# Patient Record
Sex: Female | Born: 1955 | Race: White | Hispanic: No | Marital: Married
Health system: Southern US, Community
[De-identification: ages and names within clinical notes are randomized; demographics above are authoritative.]

---

## 2004-08-16 ENCOUNTER — Ambulatory Visit: Payer: Self-pay | Admitting: Unknown Physician Specialty

## 2005-09-19 ENCOUNTER — Ambulatory Visit: Payer: Self-pay | Admitting: Unknown Physician Specialty

## 2005-09-22 ENCOUNTER — Ambulatory Visit: Payer: Self-pay | Admitting: Unknown Physician Specialty

## 2006-12-26 ENCOUNTER — Ambulatory Visit: Payer: Self-pay | Admitting: Unknown Physician Specialty

## 2007-01-02 ENCOUNTER — Ambulatory Visit: Payer: Self-pay | Admitting: Unknown Physician Specialty

## 2007-08-30 ENCOUNTER — Ambulatory Visit: Payer: Self-pay | Admitting: Family Medicine

## 2007-11-20 ENCOUNTER — Ambulatory Visit: Payer: Self-pay | Admitting: Unknown Physician Specialty

## 2008-01-01 ENCOUNTER — Ambulatory Visit: Payer: Self-pay | Admitting: Unknown Physician Specialty

## 2009-01-20 ENCOUNTER — Ambulatory Visit: Payer: Self-pay | Admitting: Unknown Physician Specialty

## 2010-02-18 ENCOUNTER — Ambulatory Visit: Payer: Self-pay | Admitting: Unknown Physician Specialty

## 2011-03-03 ENCOUNTER — Ambulatory Visit: Payer: Self-pay | Admitting: Unknown Physician Specialty

## 2012-03-08 ENCOUNTER — Ambulatory Visit: Payer: Self-pay

## 2013-05-28 ENCOUNTER — Ambulatory Visit: Payer: Self-pay | Admitting: Family Medicine

## 2013-09-05 ENCOUNTER — Ambulatory Visit: Payer: Self-pay | Admitting: Family Medicine

## 2016-01-25 ENCOUNTER — Other Ambulatory Visit: Payer: Self-pay | Admitting: Nurse Practitioner

## 2016-01-25 DIAGNOSIS — Z1231 Encounter for screening mammogram for malignant neoplasm of breast: Secondary | ICD-10-CM

## 2016-02-08 ENCOUNTER — Ambulatory Visit
Admission: RE | Admit: 2016-02-08 | Discharge: 2016-02-08 | Disposition: A | Discharge status: Home / Self Care | Payer: Self-pay | Source: Ambulatory Visit | Attending: Nurse Practitioner | Admitting: Nurse Practitioner

## 2016-02-08 DIAGNOSIS — Z1231 Encounter for screening mammogram for malignant neoplasm of breast: Secondary | ICD-10-CM | POA: Insufficient documentation

## 2018-07-24 ENCOUNTER — Other Ambulatory Visit: Payer: Self-pay | Admitting: Nurse Practitioner

## 2018-07-24 DIAGNOSIS — Z1231 Encounter for screening mammogram for malignant neoplasm of breast: Secondary | ICD-10-CM

## 2018-10-31 ENCOUNTER — Ambulatory Visit
Admission: RE | Admit: 2018-10-31 | Discharge: 2018-10-31 | Disposition: A | Discharge status: Home / Self Care | Payer: Self-pay | Source: Ambulatory Visit | Attending: Nurse Practitioner | Admitting: Nurse Practitioner

## 2018-10-31 ENCOUNTER — Other Ambulatory Visit: Payer: Self-pay

## 2018-10-31 DIAGNOSIS — Z1231 Encounter for screening mammogram for malignant neoplasm of breast: Secondary | ICD-10-CM | POA: Insufficient documentation

## 2019-02-05 ENCOUNTER — Other Ambulatory Visit: Payer: Self-pay

## 2019-02-05 DIAGNOSIS — Z20822 Contact with and (suspected) exposure to covid-19: Secondary | ICD-10-CM

## 2019-02-06 LAB — NOVEL CORONAVIRUS, NAA: SARS-CoV-2, NAA: NOT DETECTED

## 2019-04-30 ENCOUNTER — Other Ambulatory Visit: Payer: Self-pay

## 2019-04-30 ENCOUNTER — Ambulatory Visit: Payer: HRSA Program | Attending: Internal Medicine

## 2019-04-30 DIAGNOSIS — Z20828 Contact with and (suspected) exposure to other viral communicable diseases: Secondary | ICD-10-CM | POA: Insufficient documentation

## 2019-04-30 DIAGNOSIS — Z20822 Contact with and (suspected) exposure to covid-19: Secondary | ICD-10-CM

## 2019-05-01 NOTE — Progress Notes (Signed)
Order(s) created erroneously. Erroneous order ID: [id number]  Order moved by: Brigitte Pulse  Order move date/time: 05/01/2019 6:50 PM  Source Patient: [id number]  Source Contact: 04/30/2019  Destination Patient: [id number]  Destination Contact: 04/30/2019

## 2019-05-01 NOTE — Progress Notes (Signed)
Moving orders to this encounter.  

## 2019-05-02 LAB — NOVEL CORONAVIRUS, NAA: SARS-CoV-2, NAA: NOT DETECTED

## 2021-01-20 DIAGNOSIS — H903 Sensorineural hearing loss, bilateral: Secondary | ICD-10-CM | POA: Diagnosis not present

## 2021-01-20 DIAGNOSIS — H6981 Other specified disorders of Eustachian tube, right ear: Secondary | ICD-10-CM | POA: Diagnosis not present

## 2021-01-21 ENCOUNTER — Other Ambulatory Visit: Payer: Self-pay | Admitting: Nurse Practitioner

## 2021-01-21 DIAGNOSIS — R010 Benign and innocent cardiac murmurs: Secondary | ICD-10-CM | POA: Diagnosis not present

## 2021-01-21 DIAGNOSIS — E559 Vitamin D deficiency, unspecified: Secondary | ICD-10-CM | POA: Diagnosis not present

## 2021-01-21 DIAGNOSIS — M8580 Other specified disorders of bone density and structure, unspecified site: Secondary | ICD-10-CM | POA: Diagnosis not present

## 2021-01-21 DIAGNOSIS — Z1231 Encounter for screening mammogram for malignant neoplasm of breast: Secondary | ICD-10-CM

## 2021-01-21 DIAGNOSIS — G603 Idiopathic progressive neuropathy: Secondary | ICD-10-CM | POA: Diagnosis not present

## 2021-01-21 DIAGNOSIS — R5383 Other fatigue: Secondary | ICD-10-CM | POA: Diagnosis not present

## 2021-01-21 DIAGNOSIS — M2550 Pain in unspecified joint: Secondary | ICD-10-CM | POA: Diagnosis not present

## 2021-01-25 DIAGNOSIS — E559 Vitamin D deficiency, unspecified: Secondary | ICD-10-CM | POA: Diagnosis not present

## 2021-01-25 DIAGNOSIS — R7301 Impaired fasting glucose: Secondary | ICD-10-CM | POA: Diagnosis not present

## 2021-01-25 DIAGNOSIS — E785 Hyperlipidemia, unspecified: Secondary | ICD-10-CM | POA: Diagnosis not present

## 2021-01-25 DIAGNOSIS — R5383 Other fatigue: Secondary | ICD-10-CM | POA: Diagnosis not present

## 2021-01-25 DIAGNOSIS — G603 Idiopathic progressive neuropathy: Secondary | ICD-10-CM | POA: Diagnosis not present

## 2021-02-02 DIAGNOSIS — Z1211 Encounter for screening for malignant neoplasm of colon: Secondary | ICD-10-CM | POA: Diagnosis not present

## 2021-02-02 DIAGNOSIS — Z1212 Encounter for screening for malignant neoplasm of rectum: Secondary | ICD-10-CM | POA: Diagnosis not present

## 2021-02-07 DIAGNOSIS — H2513 Age-related nuclear cataract, bilateral: Secondary | ICD-10-CM | POA: Diagnosis not present

## 2021-02-08 DIAGNOSIS — R002 Palpitations: Secondary | ICD-10-CM | POA: Diagnosis not present

## 2021-02-08 DIAGNOSIS — I471 Supraventricular tachycardia: Secondary | ICD-10-CM | POA: Diagnosis not present

## 2021-02-08 DIAGNOSIS — R0602 Shortness of breath: Secondary | ICD-10-CM | POA: Diagnosis not present

## 2021-02-08 DIAGNOSIS — I340 Nonrheumatic mitral (valve) insufficiency: Secondary | ICD-10-CM | POA: Diagnosis not present

## 2021-02-22 ENCOUNTER — Other Ambulatory Visit: Payer: Self-pay

## 2021-02-22 ENCOUNTER — Ambulatory Visit
Admission: RE | Admit: 2021-02-22 | Discharge: 2021-02-22 | Disposition: A | Discharge status: Home / Self Care | Payer: Medicare Other | Source: Ambulatory Visit | Attending: Nurse Practitioner | Admitting: Nurse Practitioner

## 2021-02-22 DIAGNOSIS — M8580 Other specified disorders of bone density and structure, unspecified site: Secondary | ICD-10-CM | POA: Diagnosis not present

## 2021-02-22 DIAGNOSIS — Z8262 Family history of osteoporosis: Secondary | ICD-10-CM | POA: Diagnosis not present

## 2021-02-22 DIAGNOSIS — Z780 Asymptomatic menopausal state: Secondary | ICD-10-CM | POA: Diagnosis not present

## 2021-02-22 DIAGNOSIS — Z1231 Encounter for screening mammogram for malignant neoplasm of breast: Secondary | ICD-10-CM | POA: Diagnosis not present

## 2021-02-22 DIAGNOSIS — Z13820 Encounter for screening for osteoporosis: Secondary | ICD-10-CM | POA: Diagnosis not present

## 2021-02-22 DIAGNOSIS — M810 Age-related osteoporosis without current pathological fracture: Secondary | ICD-10-CM | POA: Diagnosis not present

## 2021-02-24 DIAGNOSIS — R0602 Shortness of breath: Secondary | ICD-10-CM | POA: Diagnosis not present

## 2021-02-28 DIAGNOSIS — R002 Palpitations: Secondary | ICD-10-CM | POA: Diagnosis not present

## 2021-02-28 DIAGNOSIS — R0602 Shortness of breath: Secondary | ICD-10-CM | POA: Diagnosis not present

## 2021-02-28 DIAGNOSIS — I340 Nonrheumatic mitral (valve) insufficiency: Secondary | ICD-10-CM | POA: Diagnosis not present

## 2021-03-01 ENCOUNTER — Other Ambulatory Visit: Payer: Self-pay | Admitting: Nurse Practitioner

## 2021-03-01 DIAGNOSIS — N6489 Other specified disorders of breast: Secondary | ICD-10-CM

## 2021-03-01 DIAGNOSIS — R928 Other abnormal and inconclusive findings on diagnostic imaging of breast: Secondary | ICD-10-CM

## 2021-03-02 ENCOUNTER — Other Ambulatory Visit: Payer: Self-pay

## 2021-03-02 ENCOUNTER — Ambulatory Visit: Payer: Medicare Other | Admitting: Dermatology

## 2021-03-02 DIAGNOSIS — L578 Other skin changes due to chronic exposure to nonionizing radiation: Secondary | ICD-10-CM

## 2021-03-02 DIAGNOSIS — L818 Other specified disorders of pigmentation: Secondary | ICD-10-CM

## 2021-03-02 DIAGNOSIS — L720 Epidermal cyst: Secondary | ICD-10-CM

## 2021-03-02 DIAGNOSIS — L821 Other seborrheic keratosis: Secondary | ICD-10-CM

## 2021-03-02 DIAGNOSIS — L820 Inflamed seborrheic keratosis: Secondary | ICD-10-CM | POA: Diagnosis not present

## 2021-03-02 DIAGNOSIS — L738 Other specified follicular disorders: Secondary | ICD-10-CM | POA: Diagnosis not present

## 2021-03-02 DIAGNOSIS — D692 Other nonthrombocytopenic purpura: Secondary | ICD-10-CM

## 2021-03-02 DIAGNOSIS — Q825 Congenital non-neoplastic nevus: Secondary | ICD-10-CM | POA: Diagnosis not present

## 2021-03-02 DIAGNOSIS — D2271 Melanocytic nevi of right lower limb, including hip: Secondary | ICD-10-CM | POA: Diagnosis not present

## 2021-03-02 DIAGNOSIS — D1800 Hemangioma unspecified site: Secondary | ICD-10-CM | POA: Diagnosis not present

## 2021-03-02 DIAGNOSIS — L814 Other melanin hyperpigmentation: Secondary | ICD-10-CM | POA: Diagnosis not present

## 2021-03-02 DIAGNOSIS — Z1283 Encounter for screening for malignant neoplasm of skin: Secondary | ICD-10-CM

## 2021-03-02 DIAGNOSIS — D229 Melanocytic nevi, unspecified: Secondary | ICD-10-CM

## 2021-03-02 NOTE — Patient Instructions (Addendum)
Cryotherapy Aftercare  Wash gently with soap and water everyday.   Apply Vaseline and Band-Aid daily until healed.   Seborrheic Keratosis  What causes seborrheic keratoses? Seborrheic keratoses are harmless, common skin growths that first appear during adult life.  As time goes by, more growths appear.  Some people may develop a large number of them.  Seborrheic keratoses appear on both covered and uncovered body parts.  They are not caused by sunlight.  The tendency to develop seborrheic keratoses can be inherited.  They vary in color from skin-colored to gray, brown, or even black.  They can be either smooth or have a rough, warty surface.   Seborrheic keratoses are superficial and look as if they were stuck on the skin.  Under the microscope this type of keratosis looks like layers upon layers of skin.  That is why at times the top layer may seem to fall off, but the rest of the growth remains and re-grows.    Treatment Seborrheic keratoses do not need to be treated, but can easily be removed in the office.  Seborrheic keratoses often cause symptoms when they rub on clothing or jewelry.  Lesions can be in the way of shaving.  If they become inflamed, they can cause itching, soreness, or burning.  Removal of a seborrheic keratosis can be accomplished by freezing, burning, or surgery. If any spot bleeds, scabs, or grows rapidly, please return to have it checked, as these can be an indication of a skin cancer.   If you have any questions or concerns for your doctor, please call our main line at [phone] and press option 4 to reach your doctor's medical assistant. If no one answers, please leave a voicemail as directed and we will return your call as soon as possible. Messages left after 4 pm will be answered the following business day.   You may also send us a message via MyChart. We typically respond to MyChart messages within 1-2 business days.  For prescription refills, please ask your  pharmacy to contact our office. Our fax number is [phone].  If you have an urgent issue when the clinic is closed that cannot wait until the next business day, you can page your doctor at the number below.    Please note that while we do our best to be available for urgent issues outside of office hours, we are not available 24/7.   If you have an urgent issue and are unable to reach us, you may choose to seek medical care at your doctor's office, retail clinic, urgent care center, or emergency room.  If you have a medical emergency, please immediately call 911 or go to the emergency department.  Pager Numbers  - Dr. Kowalski: [phone]  - Dr. Moye: [phone]  - Dr. Stewart: [phone]  In the event of inclement weather, please call our main line at [phone] for an update on the status of any delays or closures.  Dermatology Medication Tips: Please keep the boxes that topical medications come in in order to help keep track of the instructions about where and how to use these. Pharmacies typically print the medication instructions only on the boxes and not directly on the medication tubes.   If your medication is too expensive, please contact our office at [phone] option 4 or send us a message through MyChart.   We are unable to tell what your co-pay for medications will be in advance as this is different depending on your insurance coverage.   However, we may be able to find a substitute medication at lower cost or fill out paperwork to get insurance to cover a needed medication.   If a prior authorization is required to get your medication covered by your insurance company, please allow us 1-2 business days to complete this process.  Drug prices often vary depending on where the prescription is filled and some pharmacies may offer cheaper prices.  The website www.goodrx.com contains coupons for medications through different pharmacies. The prices here do not  account for what the cost may be with help from insurance (it may be cheaper with your insurance), but the website can give you the price if you did not use any insurance.  - You can print the associated coupon and take it with your prescription to the pharmacy.  - You may also stop by our office during regular business hours and pick up a GoodRx coupon card.  - If you need your prescription sent electronically to a different pharmacy, notify our office through [facility] MyChart or by phone at [phone] option 4.  

## 2021-03-02 NOTE — Progress Notes (Signed)
New Patient Visit  Subjective  Krista Goodman is a 65 y.o. female who presents for the following: Skin check.  Patient presents for TBSE. No history of skin cancer or abnormal moles. She has a spot under her left arm that she picks at.   The following portions of the chart were reviewed this encounter and updated as appropriate:       Review of Systems:  No other skin or systemic complaints except as noted in HPI or Assessment and Plan.  Objective  Well appearing patient in no apparent distress; mood and affect are within normal limits.  A full examination was performed including scalp, head, eyes, ears, nose, lips, neck, chest, axillae, abdomen, back, buttocks, bilateral upper extremities, bilateral lower extremities, hands, feet, fingers, toes, fingernails, and toenails. All findings within normal limits unless otherwise noted below.  Left Axilla Erythematous keratotic or waxy stuck-on papule   Right Forearm Violaceous patches x 3.  arms, legs Small hypopigmented macules.   R calf 2.68mm medium dark brown macule  Left Lower Abdomen Firm subcutaneous nodule 7.65mm   Assessment & Plan  Skin cancer screening performed today.  Actinic Damage - chronic, secondary to cumulative UV radiation exposure/sun exposure over time - diffuse scaly erythematous macules with underlying dyspigmentation - Recommend daily broad spectrum sunscreen SPF 30+ to sun-exposed areas, reapply every 2 hours as needed.  - Recommend staying in the shade or wearing long sleeves, sun glasses (UVA+UVB protection) and wide brim hats (4-inch brim around the entire circumference of the hat). - Call for new or changing lesions.  Lentigines - Scattered tan macules - Due to sun exposure - Benign-appering, observe - Recommend daily broad spectrum sunscreen SPF 30+ to sun-exposed areas, reapply every 2 hours as needed. - Call for any changes  Seborrheic Keratoses - Stuck-on, waxy, tan-brown papules and/or  plaques, including cheeks - Benign-appearing - Discussed benign etiology and prognosis. - Observe - Call for any changes - Discussed cosmetic procedure, noncovered.  $60 for 1st lesion and $15 for each additional lesion if done on the same day.  Maximum charge $350.  One touch-up treatment included no charge. Discussed risks of treatment including dyspigmentation, small scar, and/or recurrence. Recommend daily broad spectrum sunscreen SPF 30+/photoprotection to treated areas once healed.  Sebaceous Hyperplasia - Small yellow papules with a central dell of the forehead - Benign - Observe  Hemangiomas - Red papules - Discussed benign nature - Observe - Call for any changes  Inflamed seborrheic keratosis Left Axilla  Destruction of lesion - Left Axilla  Destruction method: cryotherapy   Informed consent: discussed and consent obtained   Lesion destroyed using liquid nitrogen: Yes   Region frozen until ice ball extended beyond lesion: Yes   Outcome: patient tolerated procedure well with no complications   Post-procedure details: wound care instructions given   Additional details:  Prior to procedure, discussed risks of blister formation, small wound, skin dyspigmentation, or rare scar following cryotherapy. Recommend Vaseline ointment to treated areas while healing.   Vascular birthmark Right Forearm  Benign. Observe.   Idiopathic guttate hypomelanosis arms, legs  Benign, observe.   Recommend daily broad spectrum sunscreen SPF 30+ to sun-exposed areas, reapply every 2 hours as needed. Call for new or changing lesions.  Staying in the shade or wearing long sleeves, sun glasses (UVA+UVB protection) and wide brim hats (4-inch brim around the entire circumference of the hat) are also recommended for sun protection.     Nevus R calf  Benign-appearing.  Observation.  Call clinic for new or changing moles.  Recommend daily use of broad spectrum spf 30+ sunscreen to sun-exposed  areas.   Epidermal inclusion cyst Left Lower Abdomen  Benign-appearing. Exam most consistent with an epidermal inclusion cyst. Discussed that a cyst is a benign growth that can grow over time and sometimes get irritated or inflamed. Recommend observation if it is not bothersome. Discussed option of surgical excision to remove it if it is growing, symptomatic, or other changes noted. Please call for new or changing lesions so they can be evaluated.    Melanocytic Nevi - Tan-brown and/or pink-flesh-colored symmetric macules and papules - Benign appearing on exam today - Observation - Call clinic for new or changing moles - Recommend daily use of broad spectrum spf 30+ sunscreen to sun-exposed areas.   Purpura - Chronic; persistent and recurrent.  Treatable, but not curable. - Violaceous macules and patches - Benign - Related to trauma, age, sun damage and/or use of blood thinners, chronic use of topical and/or oral steroids - Observe - Can use OTC arnica containing moisturizer such as Dermend Bruise Formula if desired - Call for worsening or other concerns   Return in about 1 year (around 03/02/2022) for TBSE.  IJamesetta Orleans, CMA, am acting as scribe for Brendolyn Patty, MD . Documentation: I have reviewed the above documentation for accuracy and completeness, and I agree with the above.  Brendolyn Patty MD

## 2021-03-11 ENCOUNTER — Ambulatory Visit: Payer: Medicare Other

## 2021-03-11 ENCOUNTER — Other Ambulatory Visit: Payer: Medicare Other

## 2021-03-14 ENCOUNTER — Ambulatory Visit
Admission: RE | Admit: 2021-03-14 | Discharge: 2021-03-14 | Disposition: A | Discharge status: Home / Self Care | Payer: Medicare Other | Source: Ambulatory Visit | Attending: Nurse Practitioner | Admitting: Nurse Practitioner

## 2021-03-14 ENCOUNTER — Other Ambulatory Visit: Payer: Self-pay

## 2021-03-14 DIAGNOSIS — N6489 Other specified disorders of breast: Secondary | ICD-10-CM | POA: Insufficient documentation

## 2021-03-14 DIAGNOSIS — R922 Inconclusive mammogram: Secondary | ICD-10-CM | POA: Diagnosis not present

## 2021-03-14 DIAGNOSIS — R928 Other abnormal and inconclusive findings on diagnostic imaging of breast: Secondary | ICD-10-CM | POA: Diagnosis not present

## 2021-05-25 DIAGNOSIS — L728 Other follicular cysts of the skin and subcutaneous tissue: Secondary | ICD-10-CM | POA: Diagnosis not present

## 2021-06-16 DIAGNOSIS — J300 Vasomotor rhinitis: Secondary | ICD-10-CM | POA: Diagnosis not present

## 2021-06-16 DIAGNOSIS — H903 Sensorineural hearing loss, bilateral: Secondary | ICD-10-CM | POA: Diagnosis not present

## 2021-06-20 DIAGNOSIS — M9905 Segmental and somatic dysfunction of pelvic region: Secondary | ICD-10-CM | POA: Diagnosis not present

## 2021-06-20 DIAGNOSIS — M9903 Segmental and somatic dysfunction of lumbar region: Secondary | ICD-10-CM | POA: Diagnosis not present

## 2021-06-20 DIAGNOSIS — M5417 Radiculopathy, lumbosacral region: Secondary | ICD-10-CM | POA: Diagnosis not present

## 2021-06-20 DIAGNOSIS — M545 Low back pain, unspecified: Secondary | ICD-10-CM | POA: Diagnosis not present

## 2021-06-27 DIAGNOSIS — M9905 Segmental and somatic dysfunction of pelvic region: Secondary | ICD-10-CM | POA: Diagnosis not present

## 2021-06-27 DIAGNOSIS — M545 Low back pain, unspecified: Secondary | ICD-10-CM | POA: Diagnosis not present

## 2021-06-27 DIAGNOSIS — M5417 Radiculopathy, lumbosacral region: Secondary | ICD-10-CM | POA: Diagnosis not present

## 2021-06-27 DIAGNOSIS — M9903 Segmental and somatic dysfunction of lumbar region: Secondary | ICD-10-CM | POA: Diagnosis not present

## 2021-07-06 DIAGNOSIS — Z79899 Other long term (current) drug therapy: Secondary | ICD-10-CM | POA: Diagnosis not present

## 2021-07-06 DIAGNOSIS — M461 Sacroiliitis, not elsewhere classified: Secondary | ICD-10-CM | POA: Diagnosis not present

## 2021-07-06 DIAGNOSIS — R7989 Other specified abnormal findings of blood chemistry: Secondary | ICD-10-CM | POA: Diagnosis not present

## 2021-07-06 DIAGNOSIS — E7800 Pure hypercholesterolemia, unspecified: Secondary | ICD-10-CM | POA: Diagnosis not present

## 2021-07-06 DIAGNOSIS — R002 Palpitations: Secondary | ICD-10-CM | POA: Diagnosis not present

## 2021-07-06 DIAGNOSIS — Z0000 Encounter for general adult medical examination without abnormal findings: Secondary | ICD-10-CM | POA: Diagnosis not present

## 2021-07-06 DIAGNOSIS — R739 Hyperglycemia, unspecified: Secondary | ICD-10-CM | POA: Diagnosis not present

## 2021-07-11 DIAGNOSIS — M5417 Radiculopathy, lumbosacral region: Secondary | ICD-10-CM | POA: Diagnosis not present

## 2021-07-11 DIAGNOSIS — M9905 Segmental and somatic dysfunction of pelvic region: Secondary | ICD-10-CM | POA: Diagnosis not present

## 2021-07-11 DIAGNOSIS — M9903 Segmental and somatic dysfunction of lumbar region: Secondary | ICD-10-CM | POA: Diagnosis not present

## 2021-07-11 DIAGNOSIS — M545 Low back pain, unspecified: Secondary | ICD-10-CM | POA: Diagnosis not present

## 2021-07-13 DIAGNOSIS — M5417 Radiculopathy, lumbosacral region: Secondary | ICD-10-CM | POA: Diagnosis not present

## 2021-07-13 DIAGNOSIS — M545 Low back pain, unspecified: Secondary | ICD-10-CM | POA: Diagnosis not present

## 2021-07-13 DIAGNOSIS — M9905 Segmental and somatic dysfunction of pelvic region: Secondary | ICD-10-CM | POA: Diagnosis not present

## 2021-07-13 DIAGNOSIS — M9903 Segmental and somatic dysfunction of lumbar region: Secondary | ICD-10-CM | POA: Diagnosis not present

## 2021-07-18 DIAGNOSIS — M9905 Segmental and somatic dysfunction of pelvic region: Secondary | ICD-10-CM | POA: Diagnosis not present

## 2021-07-18 DIAGNOSIS — M545 Low back pain, unspecified: Secondary | ICD-10-CM | POA: Diagnosis not present

## 2021-07-18 DIAGNOSIS — M5417 Radiculopathy, lumbosacral region: Secondary | ICD-10-CM | POA: Diagnosis not present

## 2021-07-18 DIAGNOSIS — M9903 Segmental and somatic dysfunction of lumbar region: Secondary | ICD-10-CM | POA: Diagnosis not present

## 2021-07-20 DIAGNOSIS — Z79899 Other long term (current) drug therapy: Secondary | ICD-10-CM | POA: Diagnosis not present

## 2021-07-20 DIAGNOSIS — E7800 Pure hypercholesterolemia, unspecified: Secondary | ICD-10-CM | POA: Diagnosis not present

## 2021-07-20 DIAGNOSIS — R739 Hyperglycemia, unspecified: Secondary | ICD-10-CM | POA: Diagnosis not present

## 2021-07-26 DIAGNOSIS — M5417 Radiculopathy, lumbosacral region: Secondary | ICD-10-CM | POA: Diagnosis not present

## 2021-07-26 DIAGNOSIS — M9905 Segmental and somatic dysfunction of pelvic region: Secondary | ICD-10-CM | POA: Diagnosis not present

## 2021-07-26 DIAGNOSIS — M545 Low back pain, unspecified: Secondary | ICD-10-CM | POA: Diagnosis not present

## 2021-07-26 DIAGNOSIS — M9903 Segmental and somatic dysfunction of lumbar region: Secondary | ICD-10-CM | POA: Diagnosis not present

## 2021-08-09 ENCOUNTER — Encounter: Payer: Self-pay | Admitting: Dermatology

## 2021-08-17 DIAGNOSIS — M545 Low back pain, unspecified: Secondary | ICD-10-CM | POA: Diagnosis not present

## 2021-08-17 DIAGNOSIS — M9903 Segmental and somatic dysfunction of lumbar region: Secondary | ICD-10-CM | POA: Diagnosis not present

## 2021-08-17 DIAGNOSIS — M5417 Radiculopathy, lumbosacral region: Secondary | ICD-10-CM | POA: Diagnosis not present

## 2021-08-17 DIAGNOSIS — M9905 Segmental and somatic dysfunction of pelvic region: Secondary | ICD-10-CM | POA: Diagnosis not present

## 2021-09-14 DIAGNOSIS — M9903 Segmental and somatic dysfunction of lumbar region: Secondary | ICD-10-CM | POA: Diagnosis not present

## 2021-09-14 DIAGNOSIS — M545 Low back pain, unspecified: Secondary | ICD-10-CM | POA: Diagnosis not present

## 2021-09-14 DIAGNOSIS — M9905 Segmental and somatic dysfunction of pelvic region: Secondary | ICD-10-CM | POA: Diagnosis not present

## 2021-09-14 DIAGNOSIS — M5417 Radiculopathy, lumbosacral region: Secondary | ICD-10-CM | POA: Diagnosis not present

## 2021-09-28 DIAGNOSIS — N898 Other specified noninflammatory disorders of vagina: Secondary | ICD-10-CM | POA: Diagnosis not present

## 2021-09-28 DIAGNOSIS — N888 Other specified noninflammatory disorders of cervix uteri: Secondary | ICD-10-CM | POA: Diagnosis not present

## 2021-09-28 DIAGNOSIS — N958 Other specified menopausal and perimenopausal disorders: Secondary | ICD-10-CM | POA: Diagnosis not present

## 2021-10-06 DIAGNOSIS — L814 Other melanin hyperpigmentation: Secondary | ICD-10-CM | POA: Diagnosis not present

## 2021-10-06 DIAGNOSIS — D2261 Melanocytic nevi of right upper limb, including shoulder: Secondary | ICD-10-CM | POA: Diagnosis not present

## 2021-10-06 DIAGNOSIS — L738 Other specified follicular disorders: Secondary | ICD-10-CM | POA: Diagnosis not present

## 2021-10-06 DIAGNOSIS — D2262 Melanocytic nevi of left upper limb, including shoulder: Secondary | ICD-10-CM | POA: Diagnosis not present

## 2021-10-06 DIAGNOSIS — L728 Other follicular cysts of the skin and subcutaneous tissue: Secondary | ICD-10-CM | POA: Diagnosis not present

## 2021-10-06 DIAGNOSIS — D2272 Melanocytic nevi of left lower limb, including hip: Secondary | ICD-10-CM | POA: Diagnosis not present

## 2021-10-06 DIAGNOSIS — D225 Melanocytic nevi of trunk: Secondary | ICD-10-CM | POA: Diagnosis not present

## 2021-10-06 DIAGNOSIS — R197 Diarrhea, unspecified: Secondary | ICD-10-CM | POA: Diagnosis not present

## 2021-10-06 DIAGNOSIS — L821 Other seborrheic keratosis: Secondary | ICD-10-CM | POA: Diagnosis not present

## 2021-10-06 DIAGNOSIS — D2271 Melanocytic nevi of right lower limb, including hip: Secondary | ICD-10-CM | POA: Diagnosis not present

## 2021-10-06 DIAGNOSIS — R509 Fever, unspecified: Secondary | ICD-10-CM | POA: Diagnosis not present

## 2021-10-07 DIAGNOSIS — R509 Fever, unspecified: Secondary | ICD-10-CM | POA: Diagnosis not present

## 2021-10-07 DIAGNOSIS — R197 Diarrhea, unspecified: Secondary | ICD-10-CM | POA: Diagnosis not present

## 2021-10-18 DIAGNOSIS — N958 Other specified menopausal and perimenopausal disorders: Secondary | ICD-10-CM | POA: Diagnosis not present

## 2021-10-18 DIAGNOSIS — N952 Postmenopausal atrophic vaginitis: Secondary | ICD-10-CM | POA: Diagnosis not present

## 2021-10-19 DIAGNOSIS — M9903 Segmental and somatic dysfunction of lumbar region: Secondary | ICD-10-CM | POA: Diagnosis not present

## 2021-10-19 DIAGNOSIS — M545 Low back pain, unspecified: Secondary | ICD-10-CM | POA: Diagnosis not present

## 2021-10-19 DIAGNOSIS — M9905 Segmental and somatic dysfunction of pelvic region: Secondary | ICD-10-CM | POA: Diagnosis not present

## 2021-10-19 DIAGNOSIS — M5417 Radiculopathy, lumbosacral region: Secondary | ICD-10-CM | POA: Diagnosis not present

## 2021-11-16 DIAGNOSIS — M545 Low back pain, unspecified: Secondary | ICD-10-CM | POA: Diagnosis not present

## 2021-11-16 DIAGNOSIS — M5417 Radiculopathy, lumbosacral region: Secondary | ICD-10-CM | POA: Diagnosis not present

## 2021-11-16 DIAGNOSIS — M9905 Segmental and somatic dysfunction of pelvic region: Secondary | ICD-10-CM | POA: Diagnosis not present

## 2021-11-16 DIAGNOSIS — M9903 Segmental and somatic dysfunction of lumbar region: Secondary | ICD-10-CM | POA: Diagnosis not present

## 2021-11-30 DIAGNOSIS — M9905 Segmental and somatic dysfunction of pelvic region: Secondary | ICD-10-CM | POA: Diagnosis not present

## 2021-11-30 DIAGNOSIS — M545 Low back pain, unspecified: Secondary | ICD-10-CM | POA: Diagnosis not present

## 2021-11-30 DIAGNOSIS — M5417 Radiculopathy, lumbosacral region: Secondary | ICD-10-CM | POA: Diagnosis not present

## 2021-11-30 DIAGNOSIS — M9903 Segmental and somatic dysfunction of lumbar region: Secondary | ICD-10-CM | POA: Diagnosis not present

## 2021-12-01 DIAGNOSIS — R21 Rash and other nonspecific skin eruption: Secondary | ICD-10-CM | POA: Diagnosis not present

## 2021-12-28 DIAGNOSIS — M9905 Segmental and somatic dysfunction of pelvic region: Secondary | ICD-10-CM | POA: Diagnosis not present

## 2021-12-28 DIAGNOSIS — M5417 Radiculopathy, lumbosacral region: Secondary | ICD-10-CM | POA: Diagnosis not present

## 2021-12-28 DIAGNOSIS — M9903 Segmental and somatic dysfunction of lumbar region: Secondary | ICD-10-CM | POA: Diagnosis not present

## 2021-12-28 DIAGNOSIS — M545 Low back pain, unspecified: Secondary | ICD-10-CM | POA: Diagnosis not present

## 2022-01-04 DIAGNOSIS — E7800 Pure hypercholesterolemia, unspecified: Secondary | ICD-10-CM | POA: Diagnosis not present

## 2022-01-04 DIAGNOSIS — Z1231 Encounter for screening mammogram for malignant neoplasm of breast: Secondary | ICD-10-CM | POA: Diagnosis not present

## 2022-01-04 DIAGNOSIS — R739 Hyperglycemia, unspecified: Secondary | ICD-10-CM | POA: Diagnosis not present

## 2022-01-04 DIAGNOSIS — R002 Palpitations: Secondary | ICD-10-CM | POA: Diagnosis not present

## 2022-01-04 DIAGNOSIS — Z79899 Other long term (current) drug therapy: Secondary | ICD-10-CM | POA: Diagnosis not present

## 2022-01-05 ENCOUNTER — Other Ambulatory Visit: Payer: Self-pay | Admitting: Internal Medicine

## 2022-01-05 DIAGNOSIS — Z1231 Encounter for screening mammogram for malignant neoplasm of breast: Secondary | ICD-10-CM

## 2022-01-25 DIAGNOSIS — M9903 Segmental and somatic dysfunction of lumbar region: Secondary | ICD-10-CM | POA: Diagnosis not present

## 2022-01-25 DIAGNOSIS — M545 Low back pain, unspecified: Secondary | ICD-10-CM | POA: Diagnosis not present

## 2022-01-25 DIAGNOSIS — M9905 Segmental and somatic dysfunction of pelvic region: Secondary | ICD-10-CM | POA: Diagnosis not present

## 2022-01-25 DIAGNOSIS — M5417 Radiculopathy, lumbosacral region: Secondary | ICD-10-CM | POA: Diagnosis not present

## 2022-02-09 DIAGNOSIS — M9903 Segmental and somatic dysfunction of lumbar region: Secondary | ICD-10-CM | POA: Diagnosis not present

## 2022-02-09 DIAGNOSIS — M9905 Segmental and somatic dysfunction of pelvic region: Secondary | ICD-10-CM | POA: Diagnosis not present

## 2022-02-09 DIAGNOSIS — M5417 Radiculopathy, lumbosacral region: Secondary | ICD-10-CM | POA: Diagnosis not present

## 2022-02-09 DIAGNOSIS — M545 Low back pain, unspecified: Secondary | ICD-10-CM | POA: Diagnosis not present

## 2022-02-10 DIAGNOSIS — Z79899 Other long term (current) drug therapy: Secondary | ICD-10-CM | POA: Diagnosis not present

## 2022-02-10 DIAGNOSIS — R739 Hyperglycemia, unspecified: Secondary | ICD-10-CM | POA: Diagnosis not present

## 2022-02-10 DIAGNOSIS — E7800 Pure hypercholesterolemia, unspecified: Secondary | ICD-10-CM | POA: Diagnosis not present

## 2022-02-13 DIAGNOSIS — Z6820 Body mass index (BMI) 20.0-20.9, adult: Secondary | ICD-10-CM | POA: Diagnosis not present

## 2022-02-13 DIAGNOSIS — Z008 Encounter for other general examination: Secondary | ICD-10-CM | POA: Diagnosis not present

## 2022-02-13 DIAGNOSIS — M545 Low back pain, unspecified: Secondary | ICD-10-CM | POA: Diagnosis not present

## 2022-02-13 DIAGNOSIS — R7303 Prediabetes: Secondary | ICD-10-CM | POA: Diagnosis not present

## 2022-02-13 DIAGNOSIS — I10 Essential (primary) hypertension: Secondary | ICD-10-CM | POA: Diagnosis not present

## 2022-02-15 DIAGNOSIS — M545 Low back pain, unspecified: Secondary | ICD-10-CM | POA: Diagnosis not present

## 2022-02-15 DIAGNOSIS — M9905 Segmental and somatic dysfunction of pelvic region: Secondary | ICD-10-CM | POA: Diagnosis not present

## 2022-02-15 DIAGNOSIS — M5417 Radiculopathy, lumbosacral region: Secondary | ICD-10-CM | POA: Diagnosis not present

## 2022-02-15 DIAGNOSIS — M9903 Segmental and somatic dysfunction of lumbar region: Secondary | ICD-10-CM | POA: Diagnosis not present

## 2022-02-24 DIAGNOSIS — M545 Low back pain, unspecified: Secondary | ICD-10-CM | POA: Diagnosis not present

## 2022-02-24 DIAGNOSIS — M9903 Segmental and somatic dysfunction of lumbar region: Secondary | ICD-10-CM | POA: Diagnosis not present

## 2022-02-24 DIAGNOSIS — M9905 Segmental and somatic dysfunction of pelvic region: Secondary | ICD-10-CM | POA: Diagnosis not present

## 2022-02-24 DIAGNOSIS — M5417 Radiculopathy, lumbosacral region: Secondary | ICD-10-CM | POA: Diagnosis not present

## 2022-03-13 ENCOUNTER — Encounter: Payer: Medicare Other | Admitting: Dermatology

## 2022-03-14 ENCOUNTER — Ambulatory Visit
Admission: RE | Admit: 2022-03-14 | Discharge: 2022-03-14 | Disposition: A | Discharge status: Home / Self Care | Payer: No Typology Code available for payment source | Source: Ambulatory Visit | Attending: Internal Medicine | Admitting: Internal Medicine

## 2022-03-14 DIAGNOSIS — Z1231 Encounter for screening mammogram for malignant neoplasm of breast: Secondary | ICD-10-CM | POA: Insufficient documentation

## 2022-03-15 DIAGNOSIS — M9905 Segmental and somatic dysfunction of pelvic region: Secondary | ICD-10-CM | POA: Diagnosis not present

## 2022-03-15 DIAGNOSIS — M545 Low back pain, unspecified: Secondary | ICD-10-CM | POA: Diagnosis not present

## 2022-03-15 DIAGNOSIS — M9903 Segmental and somatic dysfunction of lumbar region: Secondary | ICD-10-CM | POA: Diagnosis not present

## 2022-03-15 DIAGNOSIS — M5417 Radiculopathy, lumbosacral region: Secondary | ICD-10-CM | POA: Diagnosis not present

## 2022-04-05 DIAGNOSIS — M9905 Segmental and somatic dysfunction of pelvic region: Secondary | ICD-10-CM | POA: Diagnosis not present

## 2022-04-05 DIAGNOSIS — M5417 Radiculopathy, lumbosacral region: Secondary | ICD-10-CM | POA: Diagnosis not present

## 2022-04-05 DIAGNOSIS — M545 Low back pain, unspecified: Secondary | ICD-10-CM | POA: Diagnosis not present

## 2022-04-05 DIAGNOSIS — M9903 Segmental and somatic dysfunction of lumbar region: Secondary | ICD-10-CM | POA: Diagnosis not present

## 2022-04-26 DIAGNOSIS — M9903 Segmental and somatic dysfunction of lumbar region: Secondary | ICD-10-CM | POA: Diagnosis not present

## 2022-04-26 DIAGNOSIS — M9905 Segmental and somatic dysfunction of pelvic region: Secondary | ICD-10-CM | POA: Diagnosis not present

## 2022-04-26 DIAGNOSIS — M5417 Radiculopathy, lumbosacral region: Secondary | ICD-10-CM | POA: Diagnosis not present

## 2022-04-26 DIAGNOSIS — M545 Low back pain, unspecified: Secondary | ICD-10-CM | POA: Diagnosis not present

## 2022-05-17 DIAGNOSIS — M545 Low back pain, unspecified: Secondary | ICD-10-CM | POA: Diagnosis not present

## 2022-05-17 DIAGNOSIS — M5417 Radiculopathy, lumbosacral region: Secondary | ICD-10-CM | POA: Diagnosis not present

## 2022-05-17 DIAGNOSIS — M9903 Segmental and somatic dysfunction of lumbar region: Secondary | ICD-10-CM | POA: Diagnosis not present

## 2022-05-17 DIAGNOSIS — M9905 Segmental and somatic dysfunction of pelvic region: Secondary | ICD-10-CM | POA: Diagnosis not present

## 2022-06-14 DIAGNOSIS — M5417 Radiculopathy, lumbosacral region: Secondary | ICD-10-CM | POA: Diagnosis not present

## 2022-06-14 DIAGNOSIS — M9903 Segmental and somatic dysfunction of lumbar region: Secondary | ICD-10-CM | POA: Diagnosis not present

## 2022-06-14 DIAGNOSIS — M545 Low back pain, unspecified: Secondary | ICD-10-CM | POA: Diagnosis not present

## 2022-06-14 DIAGNOSIS — M9905 Segmental and somatic dysfunction of pelvic region: Secondary | ICD-10-CM | POA: Diagnosis not present

## 2022-07-12 DIAGNOSIS — R739 Hyperglycemia, unspecified: Secondary | ICD-10-CM | POA: Diagnosis not present

## 2022-07-12 DIAGNOSIS — M461 Sacroiliitis, not elsewhere classified: Secondary | ICD-10-CM | POA: Diagnosis not present

## 2022-07-12 DIAGNOSIS — Z79899 Other long term (current) drug therapy: Secondary | ICD-10-CM | POA: Diagnosis not present

## 2022-07-12 DIAGNOSIS — E7800 Pure hypercholesterolemia, unspecified: Secondary | ICD-10-CM | POA: Diagnosis not present

## 2022-07-12 DIAGNOSIS — Z0000 Encounter for general adult medical examination without abnormal findings: Secondary | ICD-10-CM | POA: Diagnosis not present

## 2022-07-12 DIAGNOSIS — R002 Palpitations: Secondary | ICD-10-CM | POA: Diagnosis not present

## 2022-07-12 DIAGNOSIS — Z13820 Encounter for screening for osteoporosis: Secondary | ICD-10-CM | POA: Diagnosis not present

## 2022-07-18 DIAGNOSIS — M5441 Lumbago with sciatica, right side: Secondary | ICD-10-CM | POA: Diagnosis not present

## 2022-07-18 DIAGNOSIS — G8929 Other chronic pain: Secondary | ICD-10-CM | POA: Diagnosis not present

## 2022-07-18 DIAGNOSIS — R102 Pelvic and perineal pain: Secondary | ICD-10-CM | POA: Diagnosis not present

## 2022-07-18 DIAGNOSIS — M533 Sacrococcygeal disorders, not elsewhere classified: Secondary | ICD-10-CM | POA: Diagnosis not present

## 2022-07-18 DIAGNOSIS — M25551 Pain in right hip: Secondary | ICD-10-CM | POA: Diagnosis not present

## 2022-07-19 ENCOUNTER — Other Ambulatory Visit: Payer: Self-pay | Admitting: Physical Medicine & Rehabilitation

## 2022-07-19 DIAGNOSIS — M5441 Lumbago with sciatica, right side: Secondary | ICD-10-CM

## 2022-08-02 ENCOUNTER — Ambulatory Visit
Admission: RE | Admit: 2022-08-02 | Discharge: 2022-08-02 | Disposition: A | Discharge status: Home / Self Care | Payer: No Typology Code available for payment source | Source: Ambulatory Visit | Attending: Physical Medicine & Rehabilitation | Admitting: Physical Medicine & Rehabilitation

## 2022-08-02 DIAGNOSIS — M48061 Spinal stenosis, lumbar region without neurogenic claudication: Secondary | ICD-10-CM | POA: Diagnosis not present

## 2022-08-02 DIAGNOSIS — M545 Low back pain, unspecified: Secondary | ICD-10-CM | POA: Diagnosis not present

## 2022-08-02 DIAGNOSIS — M5441 Lumbago with sciatica, right side: Secondary | ICD-10-CM

## 2022-08-02 DIAGNOSIS — M5136 Other intervertebral disc degeneration, lumbar region: Secondary | ICD-10-CM | POA: Diagnosis not present

## 2022-08-08 DIAGNOSIS — M48062 Spinal stenosis, lumbar region with neurogenic claudication: Secondary | ICD-10-CM | POA: Diagnosis not present

## 2022-08-08 DIAGNOSIS — G8929 Other chronic pain: Secondary | ICD-10-CM | POA: Diagnosis not present

## 2022-08-08 DIAGNOSIS — M5441 Lumbago with sciatica, right side: Secondary | ICD-10-CM | POA: Diagnosis not present

## 2022-08-16 DIAGNOSIS — M5417 Radiculopathy, lumbosacral region: Secondary | ICD-10-CM | POA: Diagnosis not present

## 2022-08-16 DIAGNOSIS — M9903 Segmental and somatic dysfunction of lumbar region: Secondary | ICD-10-CM | POA: Diagnosis not present

## 2022-08-16 DIAGNOSIS — M545 Low back pain, unspecified: Secondary | ICD-10-CM | POA: Diagnosis not present

## 2022-08-16 DIAGNOSIS — M9905 Segmental and somatic dysfunction of pelvic region: Secondary | ICD-10-CM | POA: Diagnosis not present

## 2022-09-05 DIAGNOSIS — M5417 Radiculopathy, lumbosacral region: Secondary | ICD-10-CM | POA: Diagnosis not present

## 2022-09-05 DIAGNOSIS — M9905 Segmental and somatic dysfunction of pelvic region: Secondary | ICD-10-CM | POA: Diagnosis not present

## 2022-09-05 DIAGNOSIS — M9903 Segmental and somatic dysfunction of lumbar region: Secondary | ICD-10-CM | POA: Diagnosis not present

## 2022-09-05 DIAGNOSIS — M545 Low back pain, unspecified: Secondary | ICD-10-CM | POA: Diagnosis not present

## 2022-10-18 DIAGNOSIS — D2262 Melanocytic nevi of left upper limb, including shoulder: Secondary | ICD-10-CM | POA: Diagnosis not present

## 2022-10-18 DIAGNOSIS — M9905 Segmental and somatic dysfunction of pelvic region: Secondary | ICD-10-CM | POA: Diagnosis not present

## 2022-10-18 DIAGNOSIS — D2272 Melanocytic nevi of left lower limb, including hip: Secondary | ICD-10-CM | POA: Diagnosis not present

## 2022-10-18 DIAGNOSIS — L814 Other melanin hyperpigmentation: Secondary | ICD-10-CM | POA: Diagnosis not present

## 2022-10-18 DIAGNOSIS — M5417 Radiculopathy, lumbosacral region: Secondary | ICD-10-CM | POA: Diagnosis not present

## 2022-10-18 DIAGNOSIS — D2271 Melanocytic nevi of right lower limb, including hip: Secondary | ICD-10-CM | POA: Diagnosis not present

## 2022-10-18 DIAGNOSIS — M545 Low back pain, unspecified: Secondary | ICD-10-CM | POA: Diagnosis not present

## 2022-10-18 DIAGNOSIS — L821 Other seborrheic keratosis: Secondary | ICD-10-CM | POA: Diagnosis not present

## 2022-10-18 DIAGNOSIS — D225 Melanocytic nevi of trunk: Secondary | ICD-10-CM | POA: Diagnosis not present

## 2022-10-18 DIAGNOSIS — M9903 Segmental and somatic dysfunction of lumbar region: Secondary | ICD-10-CM | POA: Diagnosis not present

## 2022-10-18 DIAGNOSIS — D2261 Melanocytic nevi of right upper limb, including shoulder: Secondary | ICD-10-CM | POA: Diagnosis not present

## 2022-11-03 DIAGNOSIS — M9903 Segmental and somatic dysfunction of lumbar region: Secondary | ICD-10-CM | POA: Diagnosis not present

## 2022-11-03 DIAGNOSIS — M545 Low back pain, unspecified: Secondary | ICD-10-CM | POA: Diagnosis not present

## 2022-11-03 DIAGNOSIS — M5417 Radiculopathy, lumbosacral region: Secondary | ICD-10-CM | POA: Diagnosis not present

## 2022-11-03 DIAGNOSIS — M9905 Segmental and somatic dysfunction of pelvic region: Secondary | ICD-10-CM | POA: Diagnosis not present

## 2022-11-15 DIAGNOSIS — M9905 Segmental and somatic dysfunction of pelvic region: Secondary | ICD-10-CM | POA: Diagnosis not present

## 2022-11-15 DIAGNOSIS — M545 Low back pain, unspecified: Secondary | ICD-10-CM | POA: Diagnosis not present

## 2022-11-15 DIAGNOSIS — M5417 Radiculopathy, lumbosacral region: Secondary | ICD-10-CM | POA: Diagnosis not present

## 2022-11-15 DIAGNOSIS — M9903 Segmental and somatic dysfunction of lumbar region: Secondary | ICD-10-CM | POA: Diagnosis not present

## 2022-12-13 DIAGNOSIS — M5417 Radiculopathy, lumbosacral region: Secondary | ICD-10-CM | POA: Diagnosis not present

## 2022-12-13 DIAGNOSIS — M9905 Segmental and somatic dysfunction of pelvic region: Secondary | ICD-10-CM | POA: Diagnosis not present

## 2022-12-13 DIAGNOSIS — M545 Low back pain, unspecified: Secondary | ICD-10-CM | POA: Diagnosis not present

## 2022-12-13 DIAGNOSIS — M9903 Segmental and somatic dysfunction of lumbar region: Secondary | ICD-10-CM | POA: Diagnosis not present

## 2023-01-03 DIAGNOSIS — M545 Low back pain, unspecified: Secondary | ICD-10-CM | POA: Diagnosis not present

## 2023-01-03 DIAGNOSIS — M5417 Radiculopathy, lumbosacral region: Secondary | ICD-10-CM | POA: Diagnosis not present

## 2023-01-03 DIAGNOSIS — M9903 Segmental and somatic dysfunction of lumbar region: Secondary | ICD-10-CM | POA: Diagnosis not present

## 2023-01-03 DIAGNOSIS — M9905 Segmental and somatic dysfunction of pelvic region: Secondary | ICD-10-CM | POA: Diagnosis not present

## 2023-01-17 DIAGNOSIS — E7800 Pure hypercholesterolemia, unspecified: Secondary | ICD-10-CM | POA: Diagnosis not present

## 2023-01-17 DIAGNOSIS — R002 Palpitations: Secondary | ICD-10-CM | POA: Diagnosis not present

## 2023-01-17 DIAGNOSIS — R739 Hyperglycemia, unspecified: Secondary | ICD-10-CM | POA: Diagnosis not present

## 2023-01-17 DIAGNOSIS — Z1211 Encounter for screening for malignant neoplasm of colon: Secondary | ICD-10-CM | POA: Diagnosis not present

## 2023-01-17 DIAGNOSIS — Z79899 Other long term (current) drug therapy: Secondary | ICD-10-CM | POA: Diagnosis not present

## 2023-01-18 ENCOUNTER — Other Ambulatory Visit: Payer: Self-pay | Admitting: Internal Medicine

## 2023-01-18 DIAGNOSIS — Z1231 Encounter for screening mammogram for malignant neoplasm of breast: Secondary | ICD-10-CM

## 2023-01-24 DIAGNOSIS — M545 Low back pain, unspecified: Secondary | ICD-10-CM | POA: Diagnosis not present

## 2023-01-24 DIAGNOSIS — M9903 Segmental and somatic dysfunction of lumbar region: Secondary | ICD-10-CM | POA: Diagnosis not present

## 2023-01-24 DIAGNOSIS — M5417 Radiculopathy, lumbosacral region: Secondary | ICD-10-CM | POA: Diagnosis not present

## 2023-01-24 DIAGNOSIS — Z1211 Encounter for screening for malignant neoplasm of colon: Secondary | ICD-10-CM | POA: Diagnosis not present

## 2023-01-24 DIAGNOSIS — M9905 Segmental and somatic dysfunction of pelvic region: Secondary | ICD-10-CM | POA: Diagnosis not present

## 2023-02-12 DIAGNOSIS — Z008 Encounter for other general examination: Secondary | ICD-10-CM | POA: Diagnosis not present

## 2023-02-12 DIAGNOSIS — Z6820 Body mass index (BMI) 20.0-20.9, adult: Secondary | ICD-10-CM | POA: Diagnosis not present

## 2023-02-12 DIAGNOSIS — I10 Essential (primary) hypertension: Secondary | ICD-10-CM | POA: Diagnosis not present

## 2023-02-14 DIAGNOSIS — M545 Low back pain, unspecified: Secondary | ICD-10-CM | POA: Diagnosis not present

## 2023-02-14 DIAGNOSIS — M9905 Segmental and somatic dysfunction of pelvic region: Secondary | ICD-10-CM | POA: Diagnosis not present

## 2023-02-14 DIAGNOSIS — M5417 Radiculopathy, lumbosacral region: Secondary | ICD-10-CM | POA: Diagnosis not present

## 2023-02-14 DIAGNOSIS — M9903 Segmental and somatic dysfunction of lumbar region: Secondary | ICD-10-CM | POA: Diagnosis not present

## 2023-02-16 IMAGING — MG MM DIGITAL SCREENING BILAT W/ TOMO AND CAD
6 of 10 series · 6 of 30 positions shown · non-contrast
Comparison: Previous exam(s).

CLINICAL DATA: Screening.

EXAM:
DIGITAL SCREENING BILATERAL MAMMOGRAM WITH TOMOSYNTHESIS AND CAD
TECHNIQUE: Bilateral screening digital craniocaudal and mediolateral oblique
mammograms were obtained. Bilateral screening digital breast
tomosynthesis was performed. The images were evaluated with
computer-aided detection.

[L MLO synth-2D]
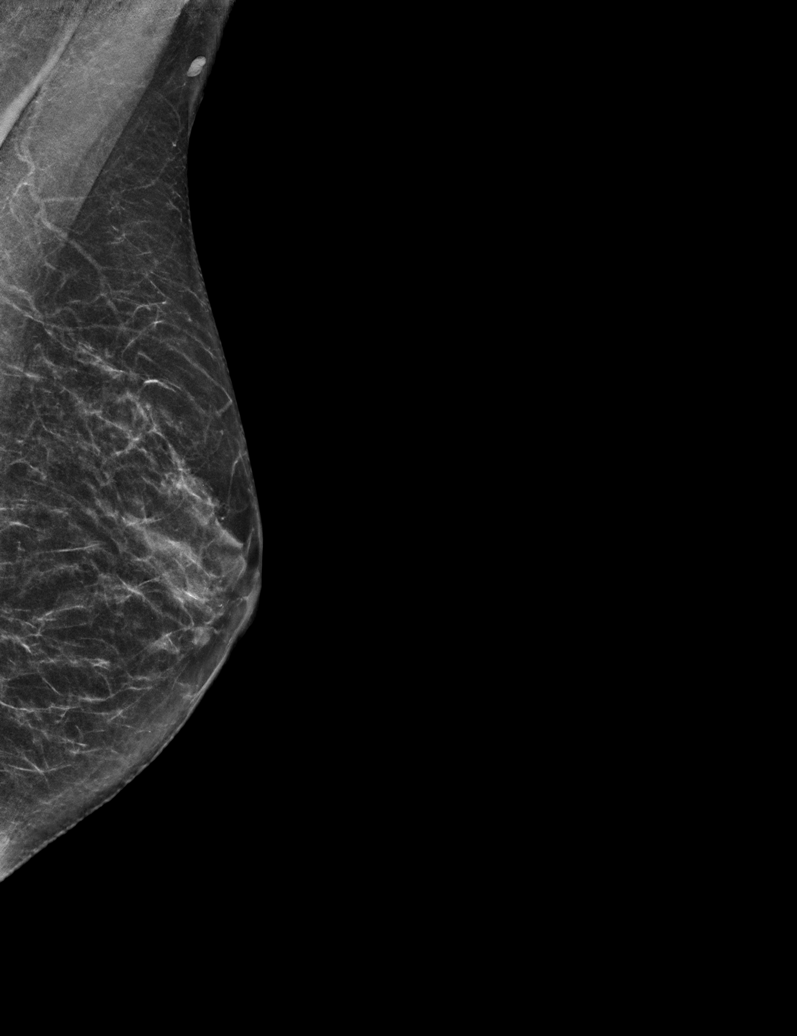

[R CC synth-2D]
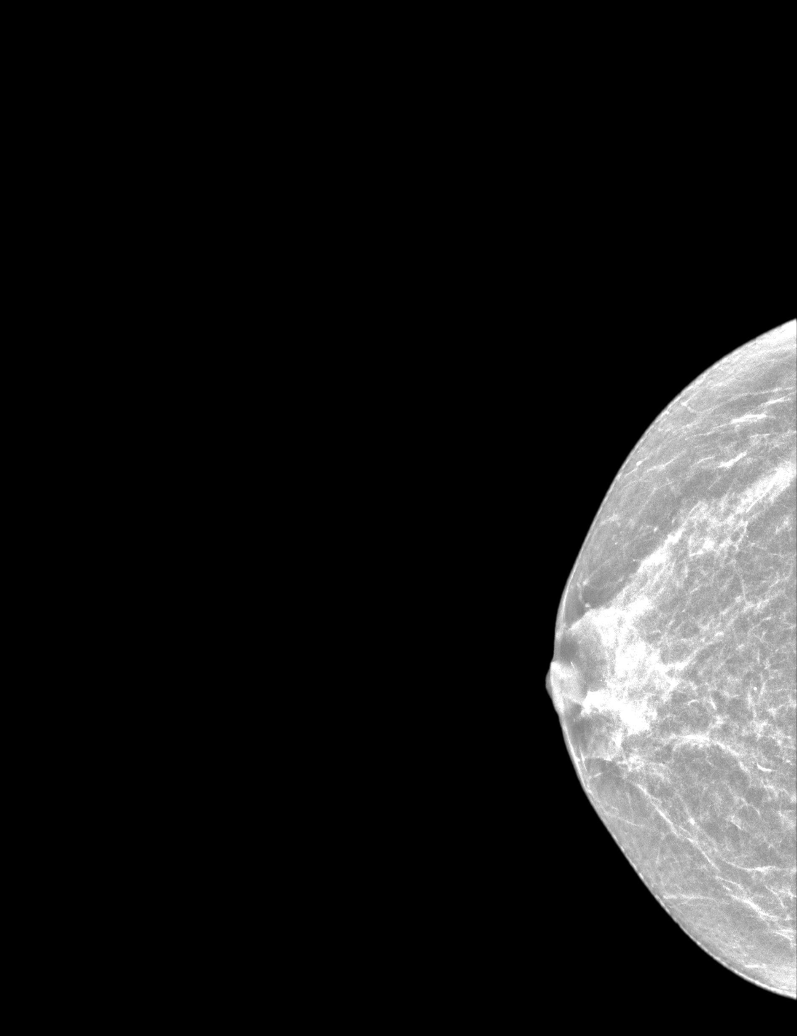

[L CC synth-2D]
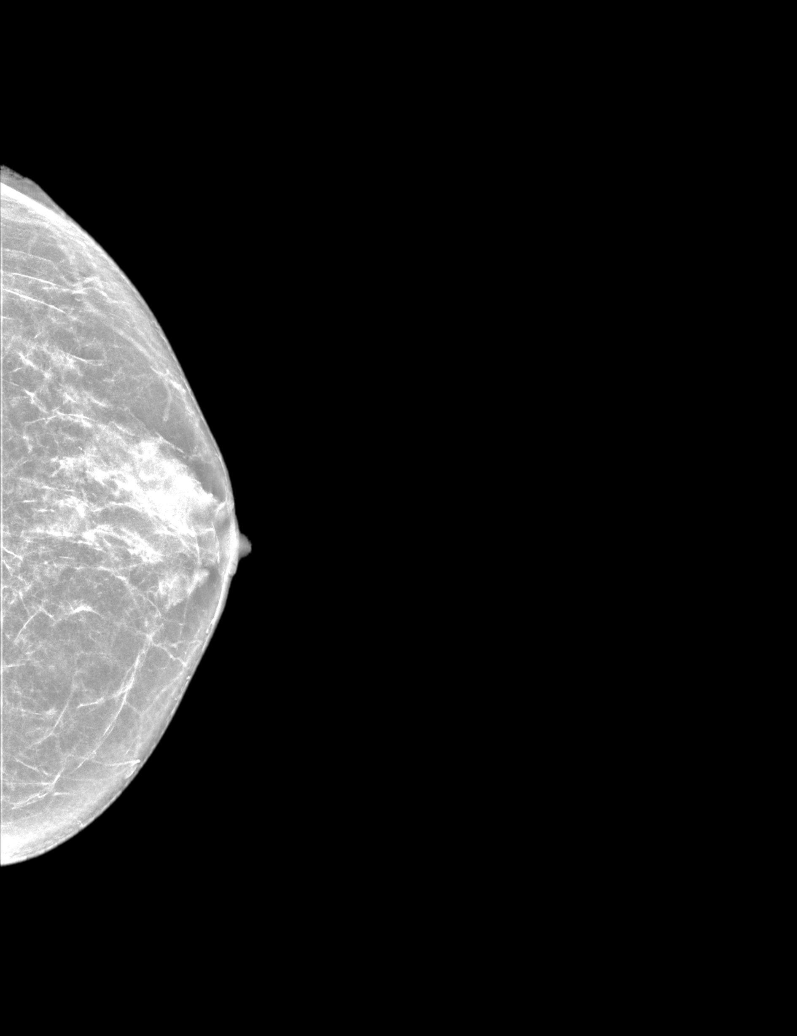

[R MLO synth-2D (1 of 2)]
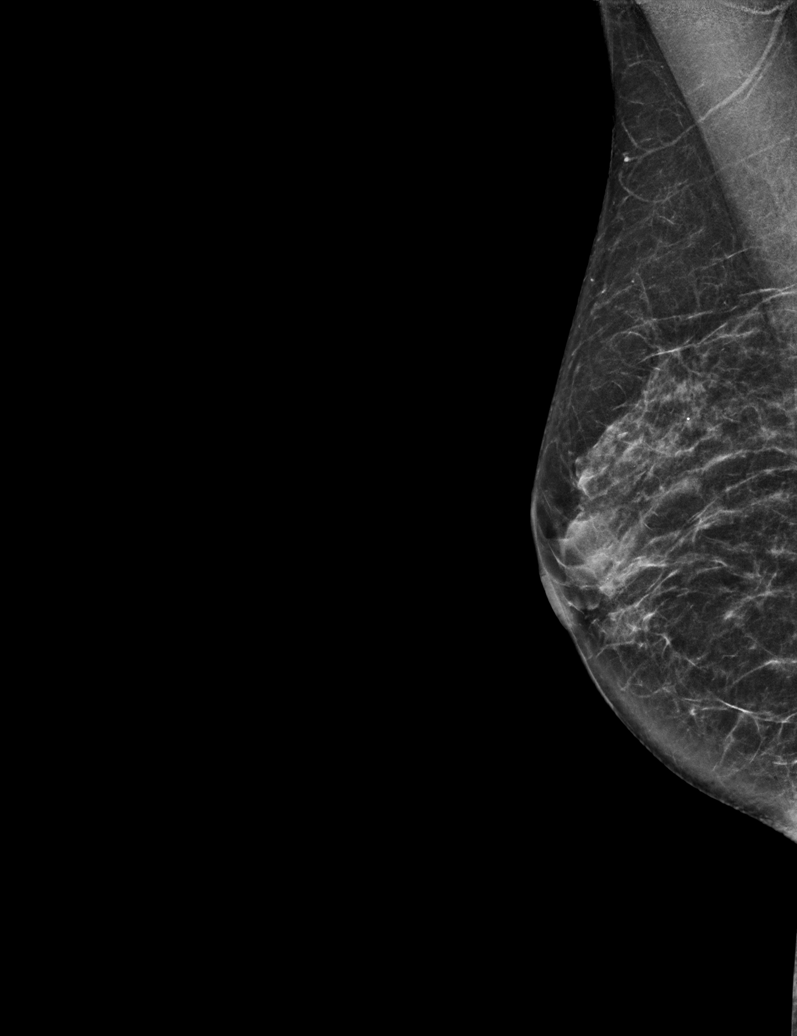

[R MLO synth-2D (2 of 2)]
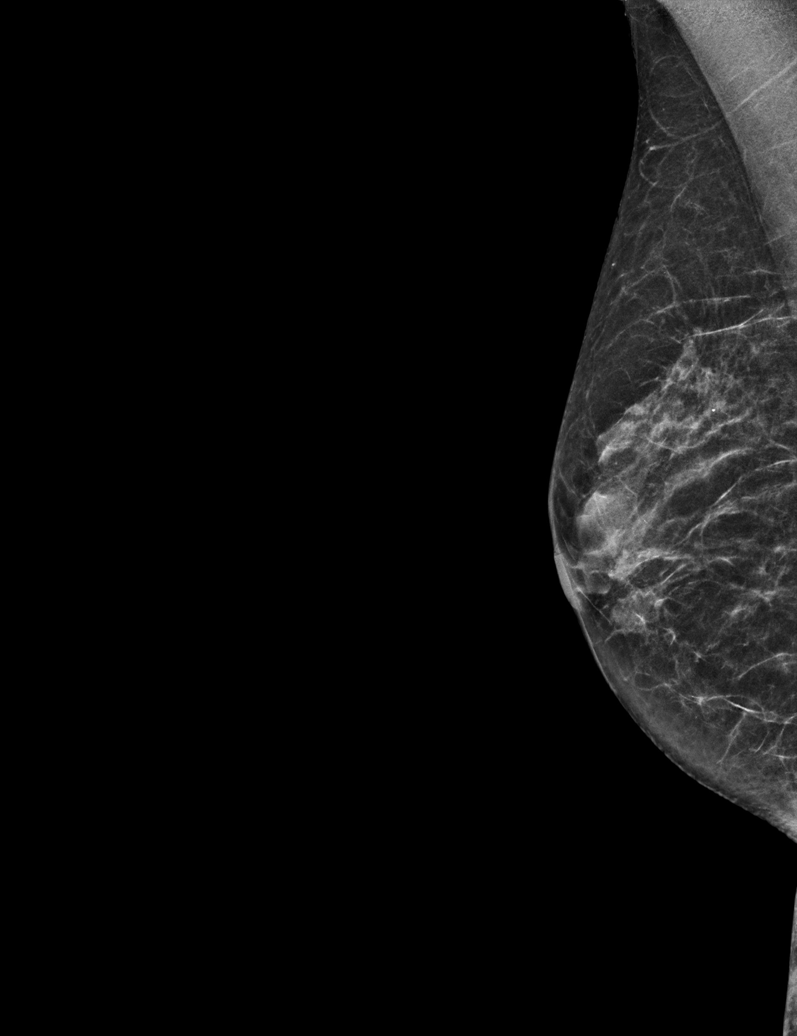

[R MLO tomo · tomo slice 26/51.0]
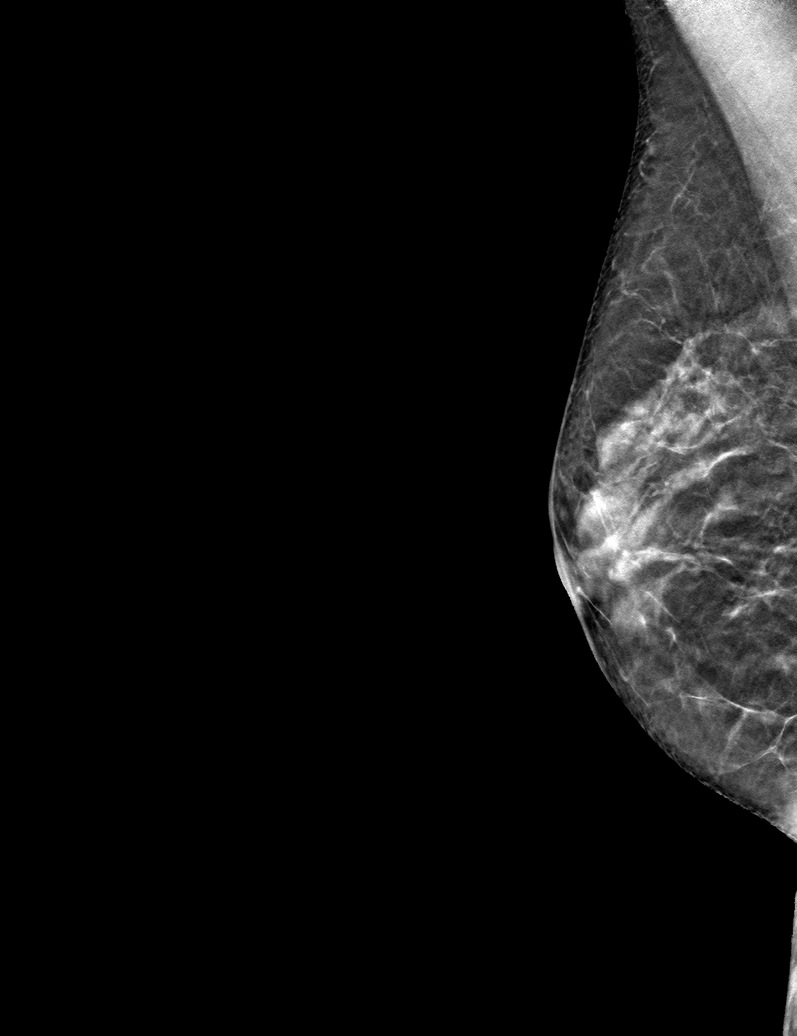

[6 of 30 positions shown; findings below may reference images not displayed]

ACR Breast Density Category b: There are scattered areas of
fibroglandular density.
FINDINGS: In the right breast, a possible asymmetry warrants further
evaluation. In the left breast, no findings suspicious for
malignancy.
IMPRESSION: Further evaluation is suggested for possible asymmetry in the right
breast.

RECOMMENDATION:
Diagnostic mammogram and possibly ultrasound of the right breast.
(Code:BU-C-IIK)

The patient will be contacted regarding the findings, and additional
imaging will be scheduled.

BI-RADS CATEGORY  0: Incomplete. Need additional imaging evaluation
and/or prior mammograms for comparison.

## 2023-02-28 DIAGNOSIS — M810 Age-related osteoporosis without current pathological fracture: Secondary | ICD-10-CM | POA: Diagnosis not present

## 2023-03-08 IMAGING — MG MM DIGITAL DIAGNOSTIC UNILAT*R* W/ TOMO W/ CAD
4 series · 4 of 12 positions shown · non-contrast
Comparison: Previous exam(s).

CLINICAL DATA: The patient was called back for a right breast
asymmetry.

EXAM:
DIGITAL DIAGNOSTIC UNILATERAL RIGHT MAMMOGRAM WITH TOMOSYNTHESIS AND
CAD
TECHNIQUE: Right digital diagnostic mammography and breast tomosynthesis was
performed. The images were evaluated with computer-aided detection.

[R CC synth-2D]
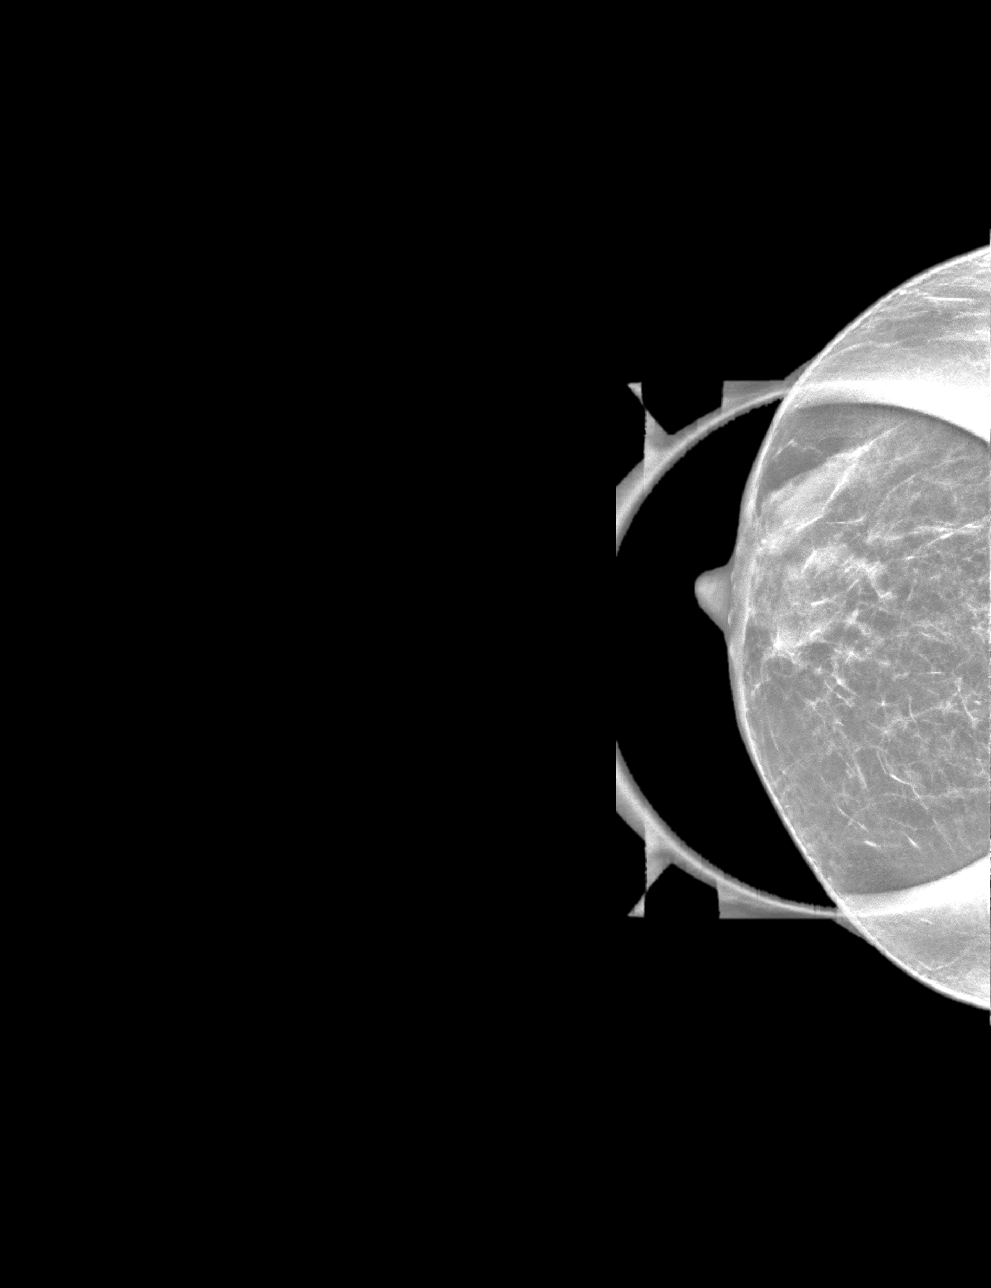

[R MLO synth-2D]
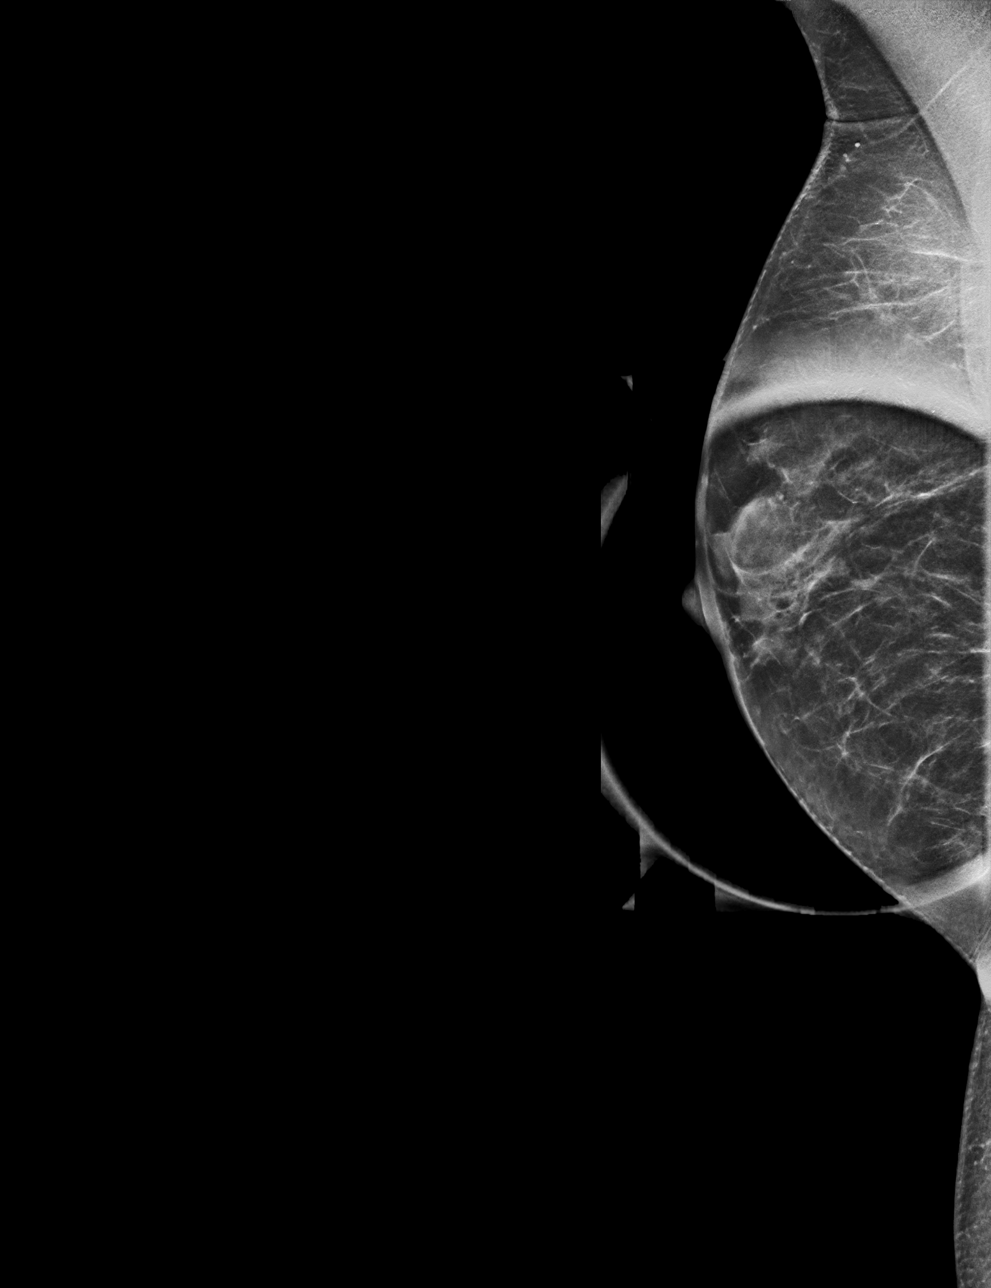

[R MLO tomo · tomo slice 23/46.0]
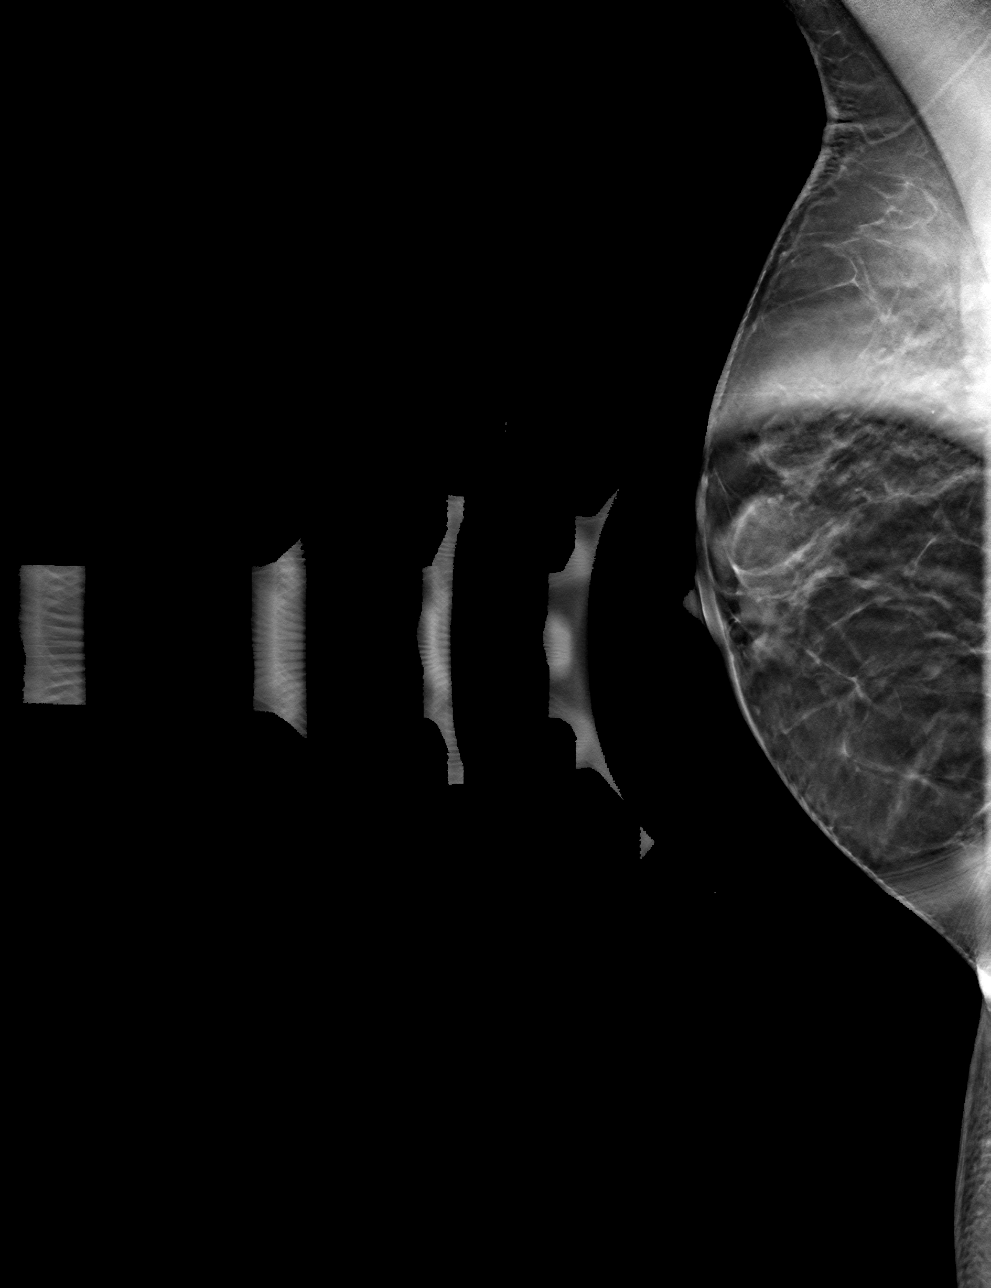

[R CC tomo · tomo slice 22/43.0]
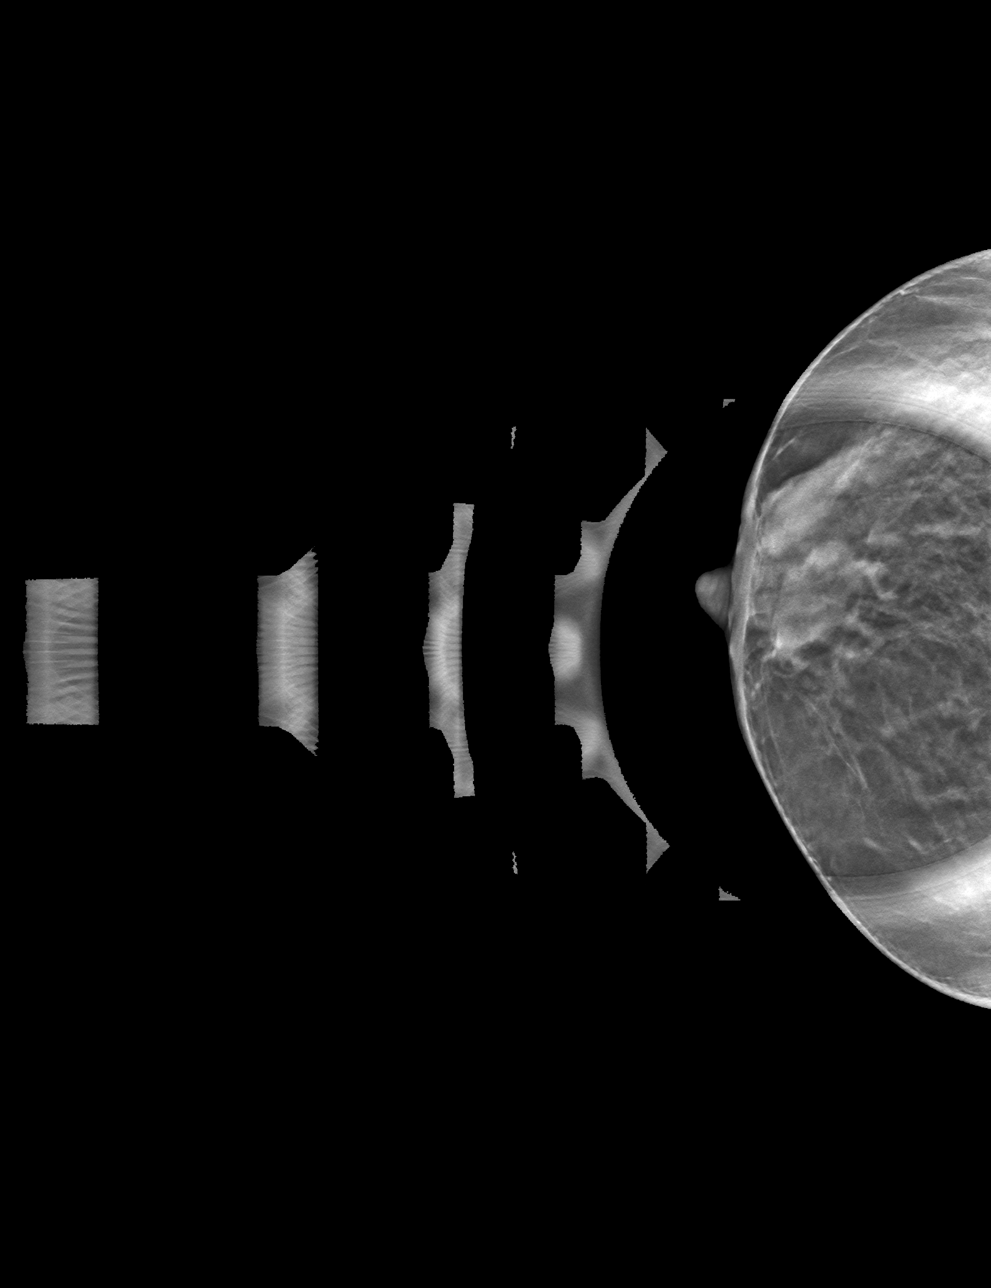

[4 of 12 positions shown; findings below may reference images not displayed]

ACR Breast Density Category c: The breast tissue is heterogeneously
dense, which may obscure small masses.
FINDINGS: The right breast asymmetry resolves on additional imaging.
IMPRESSION: No mammographic evidence of malignancy.

RECOMMENDATION:
Annual screening mammography.

I have discussed the findings and recommendations with the patient.
If applicable, a reminder letter will be sent to the patient
regarding the next appointment.

BI-RADS CATEGORY  1: Negative.

## 2023-03-14 DIAGNOSIS — H40013 Open angle with borderline findings, low risk, bilateral: Secondary | ICD-10-CM | POA: Diagnosis not present

## 2023-03-14 DIAGNOSIS — M9905 Segmental and somatic dysfunction of pelvic region: Secondary | ICD-10-CM | POA: Diagnosis not present

## 2023-03-14 DIAGNOSIS — H2513 Age-related nuclear cataract, bilateral: Secondary | ICD-10-CM | POA: Diagnosis not present

## 2023-03-14 DIAGNOSIS — M5417 Radiculopathy, lumbosacral region: Secondary | ICD-10-CM | POA: Diagnosis not present

## 2023-03-14 DIAGNOSIS — H52223 Regular astigmatism, bilateral: Secondary | ICD-10-CM | POA: Diagnosis not present

## 2023-03-14 DIAGNOSIS — H5203 Hypermetropia, bilateral: Secondary | ICD-10-CM | POA: Diagnosis not present

## 2023-03-14 DIAGNOSIS — H524 Presbyopia: Secondary | ICD-10-CM | POA: Diagnosis not present

## 2023-03-14 DIAGNOSIS — H04123 Dry eye syndrome of bilateral lacrimal glands: Secondary | ICD-10-CM | POA: Diagnosis not present

## 2023-03-14 DIAGNOSIS — M545 Low back pain, unspecified: Secondary | ICD-10-CM | POA: Diagnosis not present

## 2023-03-14 DIAGNOSIS — M9903 Segmental and somatic dysfunction of lumbar region: Secondary | ICD-10-CM | POA: Diagnosis not present

## 2023-03-21 ENCOUNTER — Ambulatory Visit
Admission: RE | Admit: 2023-03-21 | Discharge: 2023-03-21 | Disposition: A | Discharge status: Home / Self Care | Payer: No Typology Code available for payment source | Source: Ambulatory Visit | Attending: Internal Medicine | Admitting: Internal Medicine

## 2023-03-21 DIAGNOSIS — Z1231 Encounter for screening mammogram for malignant neoplasm of breast: Secondary | ICD-10-CM | POA: Diagnosis not present

## 2023-04-06 DIAGNOSIS — H524 Presbyopia: Secondary | ICD-10-CM | POA: Diagnosis not present

## 2023-04-06 DIAGNOSIS — H52223 Regular astigmatism, bilateral: Secondary | ICD-10-CM | POA: Diagnosis not present

## 2023-04-11 DIAGNOSIS — M9905 Segmental and somatic dysfunction of pelvic region: Secondary | ICD-10-CM | POA: Diagnosis not present

## 2023-04-11 DIAGNOSIS — M5417 Radiculopathy, lumbosacral region: Secondary | ICD-10-CM | POA: Diagnosis not present

## 2023-04-11 DIAGNOSIS — M545 Low back pain, unspecified: Secondary | ICD-10-CM | POA: Diagnosis not present

## 2023-04-11 DIAGNOSIS — M9903 Segmental and somatic dysfunction of lumbar region: Secondary | ICD-10-CM | POA: Diagnosis not present

## 2023-05-02 DIAGNOSIS — M545 Low back pain, unspecified: Secondary | ICD-10-CM | POA: Diagnosis not present

## 2023-05-02 DIAGNOSIS — M9905 Segmental and somatic dysfunction of pelvic region: Secondary | ICD-10-CM | POA: Diagnosis not present

## 2023-05-02 DIAGNOSIS — M9903 Segmental and somatic dysfunction of lumbar region: Secondary | ICD-10-CM | POA: Diagnosis not present

## 2023-05-02 DIAGNOSIS — M5417 Radiculopathy, lumbosacral region: Secondary | ICD-10-CM | POA: Diagnosis not present

## 2023-05-30 DIAGNOSIS — M9905 Segmental and somatic dysfunction of pelvic region: Secondary | ICD-10-CM | POA: Diagnosis not present

## 2023-05-30 DIAGNOSIS — M9903 Segmental and somatic dysfunction of lumbar region: Secondary | ICD-10-CM | POA: Diagnosis not present

## 2023-05-30 DIAGNOSIS — M5417 Radiculopathy, lumbosacral region: Secondary | ICD-10-CM | POA: Diagnosis not present

## 2023-05-30 DIAGNOSIS — M545 Low back pain, unspecified: Secondary | ICD-10-CM | POA: Diagnosis not present

## 2023-06-15 DIAGNOSIS — H903 Sensorineural hearing loss, bilateral: Secondary | ICD-10-CM | POA: Diagnosis not present

## 2023-06-27 DIAGNOSIS — M5417 Radiculopathy, lumbosacral region: Secondary | ICD-10-CM | POA: Diagnosis not present

## 2023-06-27 DIAGNOSIS — M545 Low back pain, unspecified: Secondary | ICD-10-CM | POA: Diagnosis not present

## 2023-06-27 DIAGNOSIS — M9903 Segmental and somatic dysfunction of lumbar region: Secondary | ICD-10-CM | POA: Diagnosis not present

## 2023-06-27 DIAGNOSIS — M9905 Segmental and somatic dysfunction of pelvic region: Secondary | ICD-10-CM | POA: Diagnosis not present

## 2023-07-16 DIAGNOSIS — M810 Age-related osteoporosis without current pathological fracture: Secondary | ICD-10-CM | POA: Diagnosis not present

## 2023-07-16 DIAGNOSIS — Z0000 Encounter for general adult medical examination without abnormal findings: Secondary | ICD-10-CM | POA: Diagnosis not present

## 2023-07-16 DIAGNOSIS — Z79899 Other long term (current) drug therapy: Secondary | ICD-10-CM | POA: Diagnosis not present

## 2023-07-18 DIAGNOSIS — E7800 Pure hypercholesterolemia, unspecified: Secondary | ICD-10-CM | POA: Diagnosis not present

## 2023-07-18 DIAGNOSIS — R002 Palpitations: Secondary | ICD-10-CM | POA: Diagnosis not present

## 2023-07-18 DIAGNOSIS — Z0000 Encounter for general adult medical examination without abnormal findings: Secondary | ICD-10-CM | POA: Diagnosis not present

## 2023-07-18 DIAGNOSIS — Z79899 Other long term (current) drug therapy: Secondary | ICD-10-CM | POA: Diagnosis not present

## 2023-07-18 DIAGNOSIS — M461 Sacroiliitis, not elsewhere classified: Secondary | ICD-10-CM | POA: Diagnosis not present

## 2023-07-18 DIAGNOSIS — R7989 Other specified abnormal findings of blood chemistry: Secondary | ICD-10-CM | POA: Diagnosis not present

## 2023-07-18 DIAGNOSIS — R7303 Prediabetes: Secondary | ICD-10-CM | POA: Diagnosis not present

## 2023-07-18 DIAGNOSIS — M810 Age-related osteoporosis without current pathological fracture: Secondary | ICD-10-CM | POA: Diagnosis not present

## 2023-07-24 DIAGNOSIS — M5416 Radiculopathy, lumbar region: Secondary | ICD-10-CM | POA: Diagnosis not present

## 2023-07-24 DIAGNOSIS — M5441 Lumbago with sciatica, right side: Secondary | ICD-10-CM | POA: Diagnosis not present

## 2023-07-24 DIAGNOSIS — G8929 Other chronic pain: Secondary | ICD-10-CM | POA: Diagnosis not present

## 2023-08-08 DIAGNOSIS — M9905 Segmental and somatic dysfunction of pelvic region: Secondary | ICD-10-CM | POA: Diagnosis not present

## 2023-08-08 DIAGNOSIS — M5417 Radiculopathy, lumbosacral region: Secondary | ICD-10-CM | POA: Diagnosis not present

## 2023-08-08 DIAGNOSIS — M9903 Segmental and somatic dysfunction of lumbar region: Secondary | ICD-10-CM | POA: Diagnosis not present

## 2023-08-08 DIAGNOSIS — M545 Low back pain, unspecified: Secondary | ICD-10-CM | POA: Diagnosis not present

## 2023-08-22 DIAGNOSIS — Z008 Encounter for other general examination: Secondary | ICD-10-CM | POA: Diagnosis not present

## 2023-08-22 DIAGNOSIS — M810 Age-related osteoporosis without current pathological fracture: Secondary | ICD-10-CM | POA: Diagnosis not present

## 2023-08-22 DIAGNOSIS — R7303 Prediabetes: Secondary | ICD-10-CM | POA: Diagnosis not present

## 2023-08-22 DIAGNOSIS — I10 Essential (primary) hypertension: Secondary | ICD-10-CM | POA: Diagnosis not present

## 2023-08-22 DIAGNOSIS — M545 Low back pain, unspecified: Secondary | ICD-10-CM | POA: Diagnosis not present

## 2023-09-05 DIAGNOSIS — M9903 Segmental and somatic dysfunction of lumbar region: Secondary | ICD-10-CM | POA: Diagnosis not present

## 2023-09-05 DIAGNOSIS — M5417 Radiculopathy, lumbosacral region: Secondary | ICD-10-CM | POA: Diagnosis not present

## 2023-09-05 DIAGNOSIS — M545 Low back pain, unspecified: Secondary | ICD-10-CM | POA: Diagnosis not present

## 2023-09-05 DIAGNOSIS — M9905 Segmental and somatic dysfunction of pelvic region: Secondary | ICD-10-CM | POA: Diagnosis not present

## 2023-10-03 DIAGNOSIS — M545 Low back pain, unspecified: Secondary | ICD-10-CM | POA: Diagnosis not present

## 2023-10-03 DIAGNOSIS — M5417 Radiculopathy, lumbosacral region: Secondary | ICD-10-CM | POA: Diagnosis not present

## 2023-10-03 DIAGNOSIS — M9905 Segmental and somatic dysfunction of pelvic region: Secondary | ICD-10-CM | POA: Diagnosis not present

## 2023-10-03 DIAGNOSIS — M9903 Segmental and somatic dysfunction of lumbar region: Secondary | ICD-10-CM | POA: Diagnosis not present

## 2023-10-05 DIAGNOSIS — M5416 Radiculopathy, lumbar region: Secondary | ICD-10-CM | POA: Diagnosis not present

## 2023-10-18 DIAGNOSIS — M533 Sacrococcygeal disorders, not elsewhere classified: Secondary | ICD-10-CM | POA: Diagnosis not present

## 2023-10-18 DIAGNOSIS — M5441 Lumbago with sciatica, right side: Secondary | ICD-10-CM | POA: Diagnosis not present

## 2023-10-18 DIAGNOSIS — G8929 Other chronic pain: Secondary | ICD-10-CM | POA: Diagnosis not present

## 2023-10-18 DIAGNOSIS — M5416 Radiculopathy, lumbar region: Secondary | ICD-10-CM | POA: Diagnosis not present

## 2023-10-24 DIAGNOSIS — D2261 Melanocytic nevi of right upper limb, including shoulder: Secondary | ICD-10-CM | POA: Diagnosis not present

## 2023-10-24 DIAGNOSIS — D225 Melanocytic nevi of trunk: Secondary | ICD-10-CM | POA: Diagnosis not present

## 2023-10-24 DIAGNOSIS — D2272 Melanocytic nevi of left lower limb, including hip: Secondary | ICD-10-CM | POA: Diagnosis not present

## 2023-10-24 DIAGNOSIS — D2262 Melanocytic nevi of left upper limb, including shoulder: Secondary | ICD-10-CM | POA: Diagnosis not present

## 2023-10-24 DIAGNOSIS — L814 Other melanin hyperpigmentation: Secondary | ICD-10-CM | POA: Diagnosis not present

## 2023-10-24 DIAGNOSIS — D2271 Melanocytic nevi of right lower limb, including hip: Secondary | ICD-10-CM | POA: Diagnosis not present

## 2023-10-24 DIAGNOSIS — L821 Other seborrheic keratosis: Secondary | ICD-10-CM | POA: Diagnosis not present

## 2023-11-19 DIAGNOSIS — M810 Age-related osteoporosis without current pathological fracture: Secondary | ICD-10-CM | POA: Diagnosis not present

## 2023-11-21 DIAGNOSIS — M9903 Segmental and somatic dysfunction of lumbar region: Secondary | ICD-10-CM | POA: Diagnosis not present

## 2023-11-21 DIAGNOSIS — M9905 Segmental and somatic dysfunction of pelvic region: Secondary | ICD-10-CM | POA: Diagnosis not present

## 2023-11-21 DIAGNOSIS — M545 Low back pain, unspecified: Secondary | ICD-10-CM | POA: Diagnosis not present

## 2023-11-21 DIAGNOSIS — M5417 Radiculopathy, lumbosacral region: Secondary | ICD-10-CM | POA: Diagnosis not present

## 2023-11-27 DIAGNOSIS — M5416 Radiculopathy, lumbar region: Secondary | ICD-10-CM | POA: Diagnosis not present

## 2023-11-27 DIAGNOSIS — M6281 Muscle weakness (generalized): Secondary | ICD-10-CM | POA: Diagnosis not present

## 2023-12-04 DIAGNOSIS — M5416 Radiculopathy, lumbar region: Secondary | ICD-10-CM | POA: Diagnosis not present

## 2023-12-04 DIAGNOSIS — M6281 Muscle weakness (generalized): Secondary | ICD-10-CM | POA: Diagnosis not present

## 2023-12-07 DIAGNOSIS — M810 Age-related osteoporosis without current pathological fracture: Secondary | ICD-10-CM | POA: Diagnosis not present

## 2023-12-31 DIAGNOSIS — M5417 Radiculopathy, lumbosacral region: Secondary | ICD-10-CM | POA: Diagnosis not present

## 2023-12-31 DIAGNOSIS — M9903 Segmental and somatic dysfunction of lumbar region: Secondary | ICD-10-CM | POA: Diagnosis not present

## 2023-12-31 DIAGNOSIS — M9905 Segmental and somatic dysfunction of pelvic region: Secondary | ICD-10-CM | POA: Diagnosis not present

## 2023-12-31 DIAGNOSIS — M545 Low back pain, unspecified: Secondary | ICD-10-CM | POA: Diagnosis not present

## 2024-01-23 DIAGNOSIS — R7303 Prediabetes: Secondary | ICD-10-CM | POA: Diagnosis not present

## 2024-01-23 DIAGNOSIS — E7800 Pure hypercholesterolemia, unspecified: Secondary | ICD-10-CM | POA: Diagnosis not present

## 2024-01-23 DIAGNOSIS — Z79899 Other long term (current) drug therapy: Secondary | ICD-10-CM | POA: Diagnosis not present

## 2024-01-28 DIAGNOSIS — E7800 Pure hypercholesterolemia, unspecified: Secondary | ICD-10-CM | POA: Diagnosis not present

## 2024-01-28 DIAGNOSIS — R002 Palpitations: Secondary | ICD-10-CM | POA: Diagnosis not present

## 2024-01-28 DIAGNOSIS — R7989 Other specified abnormal findings of blood chemistry: Secondary | ICD-10-CM | POA: Diagnosis not present

## 2024-01-28 DIAGNOSIS — M810 Age-related osteoporosis without current pathological fracture: Secondary | ICD-10-CM | POA: Diagnosis not present

## 2024-01-28 DIAGNOSIS — R7303 Prediabetes: Secondary | ICD-10-CM | POA: Diagnosis not present

## 2024-01-28 DIAGNOSIS — Z1231 Encounter for screening mammogram for malignant neoplasm of breast: Secondary | ICD-10-CM | POA: Diagnosis not present

## 2024-01-28 DIAGNOSIS — Z79899 Other long term (current) drug therapy: Secondary | ICD-10-CM | POA: Diagnosis not present

## 2024-01-28 DIAGNOSIS — Z1211 Encounter for screening for malignant neoplasm of colon: Secondary | ICD-10-CM | POA: Diagnosis not present

## 2024-01-28 DIAGNOSIS — Z0000 Encounter for general adult medical examination without abnormal findings: Secondary | ICD-10-CM | POA: Diagnosis not present

## 2024-01-29 ENCOUNTER — Other Ambulatory Visit: Payer: Self-pay | Admitting: Internal Medicine

## 2024-01-29 DIAGNOSIS — Z1231 Encounter for screening mammogram for malignant neoplasm of breast: Secondary | ICD-10-CM

## 2024-02-04 DIAGNOSIS — M5417 Radiculopathy, lumbosacral region: Secondary | ICD-10-CM | POA: Diagnosis not present

## 2024-02-04 DIAGNOSIS — M9903 Segmental and somatic dysfunction of lumbar region: Secondary | ICD-10-CM | POA: Diagnosis not present

## 2024-02-04 DIAGNOSIS — M545 Low back pain, unspecified: Secondary | ICD-10-CM | POA: Diagnosis not present

## 2024-02-04 DIAGNOSIS — M9905 Segmental and somatic dysfunction of pelvic region: Secondary | ICD-10-CM | POA: Diagnosis not present

## 2024-02-08 DIAGNOSIS — Z1211 Encounter for screening for malignant neoplasm of colon: Secondary | ICD-10-CM | POA: Diagnosis not present

## 2024-03-03 DIAGNOSIS — M5417 Radiculopathy, lumbosacral region: Secondary | ICD-10-CM | POA: Diagnosis not present

## 2024-03-03 DIAGNOSIS — M545 Low back pain, unspecified: Secondary | ICD-10-CM | POA: Diagnosis not present

## 2024-03-03 DIAGNOSIS — M9903 Segmental and somatic dysfunction of lumbar region: Secondary | ICD-10-CM | POA: Diagnosis not present

## 2024-03-03 DIAGNOSIS — M9905 Segmental and somatic dysfunction of pelvic region: Secondary | ICD-10-CM | POA: Diagnosis not present

## 2024-03-21 ENCOUNTER — Ambulatory Visit
Admission: RE | Admit: 2024-03-21 | Discharge: 2024-03-21 | Disposition: A | Discharge status: Home / Self Care | Source: Ambulatory Visit | Attending: Internal Medicine | Admitting: Internal Medicine

## 2024-03-21 DIAGNOSIS — Z1231 Encounter for screening mammogram for malignant neoplasm of breast: Secondary | ICD-10-CM | POA: Insufficient documentation
